# Patient Record
Sex: Male | Born: 1950 | Race: White | Hispanic: No | Marital: Married | Smoking: Never smoker
Health system: Southern US, Community
[De-identification: ages and names within clinical notes are randomized; demographics above are authoritative.]

## PROBLEM LIST (undated history)

## (undated) DIAGNOSIS — R7303 Prediabetes: Secondary | ICD-10-CM

## (undated) DIAGNOSIS — F0390 Unspecified dementia without behavioral disturbance: Secondary | ICD-10-CM

## (undated) DIAGNOSIS — M109 Gout, unspecified: Secondary | ICD-10-CM

## (undated) DIAGNOSIS — N200 Calculus of kidney: Secondary | ICD-10-CM

## (undated) DIAGNOSIS — Z87442 Personal history of urinary calculi: Secondary | ICD-10-CM

## (undated) DIAGNOSIS — N529 Male erectile dysfunction, unspecified: Secondary | ICD-10-CM

## (undated) DIAGNOSIS — I10 Essential (primary) hypertension: Secondary | ICD-10-CM

## (undated) HISTORY — DX: Gout, unspecified: M10.9

## (undated) HISTORY — DX: Calculus of kidney: N20.0

## (undated) HISTORY — DX: Prediabetes: R73.03

## (undated) HISTORY — DX: Male erectile dysfunction, unspecified: N52.9

---

## 1997-12-29 ENCOUNTER — Emergency Department (HOSPITAL_COMMUNITY): Admission: EM | Admit: 1997-12-29 | Discharge: 1997-12-29 | Payer: Self-pay | Admitting: Emergency Medicine

## 1998-06-29 ENCOUNTER — Encounter: Payer: Self-pay | Admitting: Emergency Medicine

## 1998-06-29 ENCOUNTER — Emergency Department (HOSPITAL_COMMUNITY): Admission: EM | Admit: 1998-06-29 | Discharge: 1998-06-29 | Payer: Self-pay | Admitting: Emergency Medicine

## 2005-02-13 ENCOUNTER — Ambulatory Visit: Payer: Self-pay | Admitting: Pulmonary Disease

## 2005-08-10 HISTORY — PX: COLONOSCOPY: SHX174

## 2012-07-22 ENCOUNTER — Emergency Department (HOSPITAL_COMMUNITY)
Admission: EM | Admit: 2012-07-22 | Discharge: 2012-07-22 | Disposition: A | Discharge status: Home / Self Care | Payer: BC Managed Care – PPO | Attending: Emergency Medicine | Admitting: Emergency Medicine

## 2012-07-22 ENCOUNTER — Emergency Department (HOSPITAL_COMMUNITY): Payer: BC Managed Care – PPO

## 2012-07-22 ENCOUNTER — Encounter (HOSPITAL_COMMUNITY): Payer: Self-pay | Admitting: *Deleted

## 2012-07-22 DIAGNOSIS — Z79899 Other long term (current) drug therapy: Secondary | ICD-10-CM | POA: Insufficient documentation

## 2012-07-22 DIAGNOSIS — N132 Hydronephrosis with renal and ureteral calculous obstruction: Secondary | ICD-10-CM

## 2012-07-22 DIAGNOSIS — I10 Essential (primary) hypertension: Secondary | ICD-10-CM | POA: Insufficient documentation

## 2012-07-22 DIAGNOSIS — N201 Calculus of ureter: Secondary | ICD-10-CM | POA: Insufficient documentation

## 2012-07-22 DIAGNOSIS — N23 Unspecified renal colic: Secondary | ICD-10-CM

## 2012-07-22 DIAGNOSIS — N1330 Unspecified hydronephrosis: Secondary | ICD-10-CM | POA: Insufficient documentation

## 2012-07-22 DIAGNOSIS — Z87442 Personal history of urinary calculi: Secondary | ICD-10-CM | POA: Insufficient documentation

## 2012-07-22 HISTORY — DX: Essential (primary) hypertension: I10

## 2012-07-22 LAB — COMPREHENSIVE METABOLIC PANEL
AST: 27 U/L (ref 0–37)
CO2: 25 mEq/L (ref 19–32)
Chloride: 102 mEq/L (ref 96–112)
Creatinine, Ser: 1.11 mg/dL (ref 0.50–1.35)
GFR calc non Af Amer: 70 mL/min — ABNORMAL LOW (ref >90)
Glucose, Bld: 138 mg/dL — ABNORMAL HIGH (ref 70–99)
Total Bilirubin: 0.6 mg/dL (ref 0.3–1.2)

## 2012-07-22 LAB — CBC WITH DIFFERENTIAL/PLATELET
Basophils Absolute: 0 10*3/uL (ref 0.0–0.1)
Basophils Relative: 0 % (ref 0–1)
Eosinophils Absolute: 0 10*3/uL (ref 0.0–0.7)
Eosinophils Relative: 0 % (ref 0–5)
HCT: 44.2 % (ref 39.0–52.0)
Hemoglobin: 15.7 g/dL (ref 13.0–17.0)
Lymphocytes Relative: 6 % — ABNORMAL LOW (ref 12–46)
Lymphs Abs: 0.9 10*3/uL (ref 0.7–4.0)
MCH: 31.2 pg (ref 26.0–34.0)
MCHC: 35.5 g/dL (ref 30.0–36.0)
MCV: 87.7 fL (ref 78.0–100.0)
Monocytes Absolute: 0.8 10*3/uL (ref 0.1–1.0)
Monocytes Relative: 5 % (ref 3–12)
Neutro Abs: 13.1 10*3/uL — ABNORMAL HIGH (ref 1.7–7.7)
Neutrophils Relative %: 89 % — ABNORMAL HIGH (ref 43–77)
Platelets: 216 10*3/uL (ref 150–400)
RBC: 5.04 MIL/uL (ref 4.22–5.81)
RDW: 13.4 % (ref 11.5–15.5)
WBC: 14.8 10*3/uL — ABNORMAL HIGH (ref 4.0–10.5)

## 2012-07-22 LAB — URINALYSIS, MICROSCOPIC ONLY
Glucose, UA: NEGATIVE mg/dL
Hgb urine dipstick: NEGATIVE
Ketones, ur: 40 mg/dL — AB
Leukocytes, UA: NEGATIVE
Protein, ur: NEGATIVE mg/dL

## 2012-07-22 MED ORDER — TAMSULOSIN HCL 0.4 MG PO CAPS: ORAL_CAPSULE | ORAL | Status: DC

## 2012-07-22 MED ORDER — OXYCODONE-ACETAMINOPHEN 5-325 MG PO TABS: 1.0000 | ORAL_TABLET | ORAL | Status: DC | PRN

## 2012-07-22 NOTE — ED Provider Notes (Signed)
History     CSN: [id number]  Arrival date & time 07/22/12  1406   First MD Initiated Contact with Patient 07/22/12 1612      Chief Complaint  Patient presents with  . Abdominal Pain    (Consider location/radiation/quality/duration/timing/severity/associated sxs/prior treatment) HPI Comments: Andrew Lester is a 62 y.o. male who complains of right flank pain, waxing and waning, cramp-like, since 2 AM today. He has had similar pain in the past when he had a kidney stone. He denies anorexia, but has not eaten today. There's been no fever. There's been no urinary frequency, dysuria, or hematuria. No nausea, vomiting, or diarrhea. His last bowel movement was this morning. He occasionally has constipation. He did not take any medications for the problem. There are no known modifying factors.  Patient is a 62 y.o. male presenting with abdominal pain. The history is provided by the patient.  Abdominal Pain   Past Medical History  Diagnosis Date  . Hypertension     History reviewed. No pertinent past surgical history.  No family history on file.  History  Substance Use Topics  . Smoking status: Never Smoker   . Smokeless tobacco: Not on file  . Alcohol Use: Yes     Comment: occassionally      Review of Systems  Gastrointestinal: Positive for abdominal pain.  All other systems reviewed and are negative.    Allergies  Amoxicillin and Erythromycin  Home Medications   Current Outpatient Rx  Name  Route  Sig  Dispense  Refill  . hydrochlorothiazide (HYDRODIURIL) 25 MG tablet   Oral   Take 25 mg by mouth every evening.         Marland Kitchen oxyCODONE-acetaminophen (PERCOCET/ROXICET) 5-325 MG per tablet   Oral   Take 1 tablet by mouth every 4 (four) hours as needed for pain.   20 tablet   0   . tamsulosin (FLOMAX) 0.4 MG CAPS      1 q HS to aid stone passage   7 capsule   0     BP 128/75  Pulse 86  Temp(Src) 99.4 F (37.4 C) (Oral)  Resp 16  SpO2 99%  Physical Exam   Nursing note and vitals reviewed. Constitutional: He is oriented to person, place, and time. He appears well-developed and well-nourished.  HENT:  Head: Normocephalic and atraumatic.  Right Ear: External ear normal.  Left Ear: External ear normal.  Eyes: Conjunctivae and EOM are normal. Pupils are equal, round, and reactive to light.  Neck: Normal range of motion and phonation normal. Neck supple.  Cardiovascular: Normal rate, regular rhythm, normal heart sounds and intact distal pulses.   Pulmonary/Chest: Effort normal and breath sounds normal. He exhibits no bony tenderness.  Abdominal: Soft. Normal appearance. There is no tenderness.  Genitourinary:  Mild right costovertebral angle tenderness with percussion  Musculoskeletal: Normal range of motion.  Neurological: He is alert and oriented to person, place, and time. He has normal strength. No cranial nerve deficit or sensory deficit. He exhibits normal muscle tone. Coordination normal.  Skin: Skin is warm, dry and intact.  Psychiatric: He has a normal mood and affect. His behavior is normal. Judgment and thought content normal.    ED Course  Procedures (including critical care time)  Labs Reviewed  CBC WITH DIFFERENTIAL - Abnormal; Notable for the following:    WBC 14.8 (*)    Neutrophils Relative 89 (*)    Neutro Abs 13.1 (*)    Lymphocytes Relative 6 (*)  All other components within normal limits  COMPREHENSIVE METABOLIC PANEL - Abnormal; Notable for the following:    Glucose, Bld 138 (*)    GFR calc non Af Amer 70 (*)    GFR calc Af Amer 81 (*)    All other components within normal limits  URINALYSIS, MICROSCOPIC ONLY - Abnormal; Notable for the following:    Ketones, ur 40 (*)    All other components within normal limits  LIPASE, BLOOD   Ct Abdomen Pelvis Wo Contrast  07/22/2012  *RADIOLOGY REPORT*  Clinical Data: Right flank and right lower quadrant pain.  CT ABDOMEN AND PELVIS WITHOUT CONTRAST  Technique:   Multidetector CT imaging of the abdomen and pelvis was performed following the standard protocol without intravenous contrast.  Comparison: None.  Findings: Dependent atelectatic change is seen in the lung bases. There is no pleural or pericardial effusion.  The patient has marked right hydronephrosis with stranding about the right kidney and ureter due to a 0.4 cm stone at the right UVJ. No other urinary tract stones are identified.  The kidneys are otherwise unremarkable.  A few small stones identified in the gallbladder but there is no CT evidence of cholecystitis.  The liver is diffusely low attenuating consistent with fatty infiltration.  No focal liver lesion is seen. The spleen, adrenal glands and pancreas appear normal.  Urinary bladder, seminal vesicles and prostate gland are unremarkable.  The patient has fairly extensive sigmoid diverticular disease with scattered diverticula seen throughout the remainder the colon.  No evidence of diverticulitis is identified.  The colon is otherwise unremarkable.  The stomach, small bowel and appendix appear normal. There is no lymphadenopathy or fluid.  No focal bony abnormality is identified.  IMPRESSION:  1.  Marked right hydronephrosis due to a 0.4 cm right UVJ stone. No other urinary tract stones are identified. 2.  Small gallstones without evidence of cholecystitis. 3.  Diffuse fatty infiltration of the liver. 4.  Diverticulosis without diverticulitis.   Original Report Authenticated By: Holley Dexter, M.D.      1. Ureteral colic   2. Ureteral stone with hydronephrosis       MDM  Small distal right ureteral stone, with high likelihood of passage. Doubt UTI. No renal failure. Doubt metabolic instability, serious bacterial infection or impending vascular collPercocet,apse; the patient is stable for discharge.     Plan: Home Medications- Percocet, Flomax; Home Treatments- strain urine; Recommended follow up- urology. One week     Flint Melter, MD 07/22/12 2311

## 2012-07-22 NOTE — ED Notes (Signed)
Pt escorted to discharge window. Pt verbalized understanding discharge instructions. In no acute distress.  

## 2012-07-22 NOTE — ED Notes (Signed)
Pt reports RLQ and flank pain since 0200 this am. Denies n/v. Appendicitis 38yrs ago, appendix not removed at that time. Hx kidney stones. Denies hematuria, sts pain feels different than previous stones.

## 2012-07-22 NOTE — ED Notes (Signed)
Pt alert and oriented x4. Respirations even and unlabored, bilateral symmetrical rise and fall of chest. Skin warm and dry. In no acute distress. Denies needs.   

## 2014-02-17 IMAGING — CT CT ABD-PELV W/O CM
1 series · 14 of 22 positions shown, 19 images · non-contrast
Comparison: None.

CLINICAL DATA: Right flank and right lower quadrant pain.

CT ABDOMEN AND PELVIS WITHOUT CONTRAST
TECHNIQUE: Multidetector CT imaging of the abdomen and pelvis was
performed following the standard protocol without intravenous
contrast.

[Series 4: lung · axial · 0.87mm/px · z∈[+1416,+1511]mm · 14 of 22 slices shown, 19 images]
[im 2/22  soft-tissue]
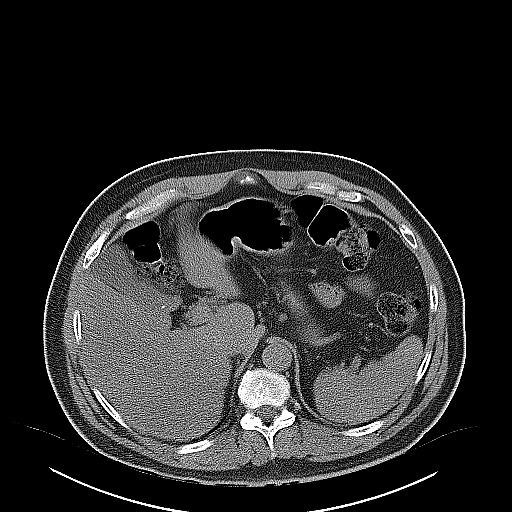
[im 2/22  bone]
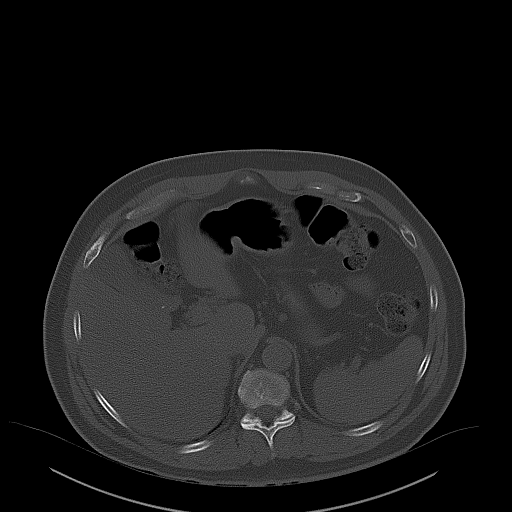
[im 4/22  soft-tissue]
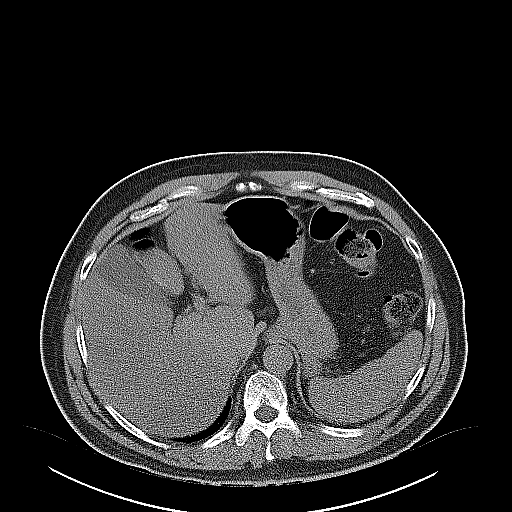
[im 5/22  soft-tissue]
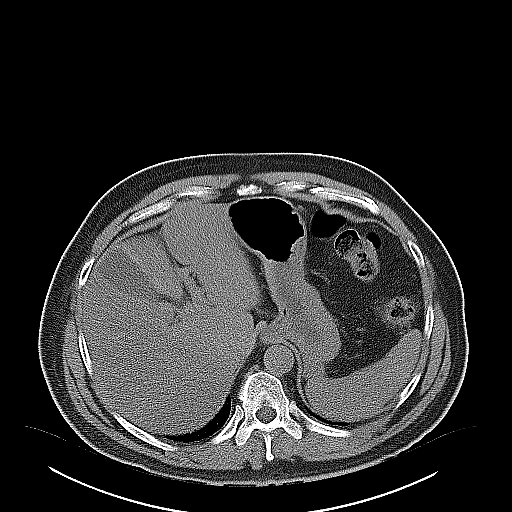
[im 7/22  soft-tissue]
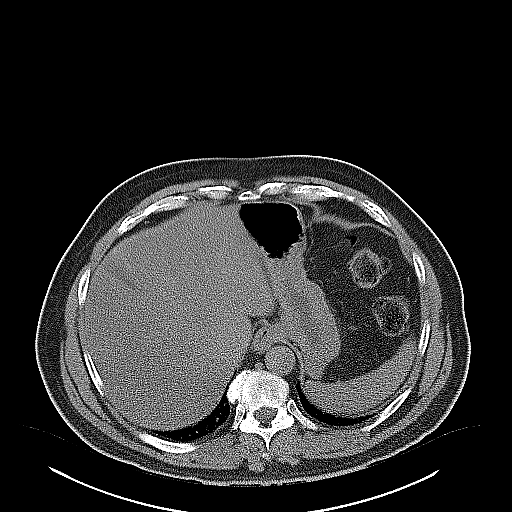
[im 8/22  soft-tissue]
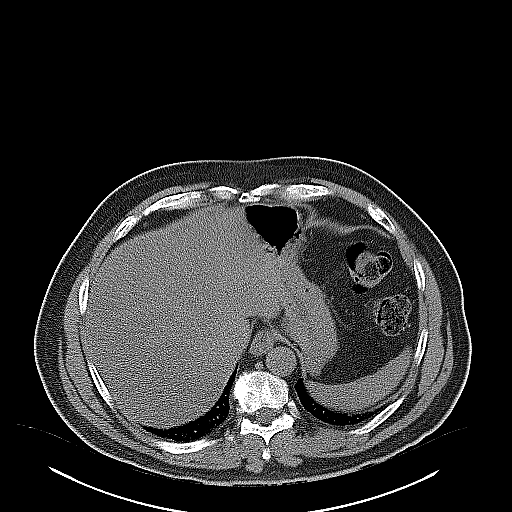
[im 10/22  soft-tissue]
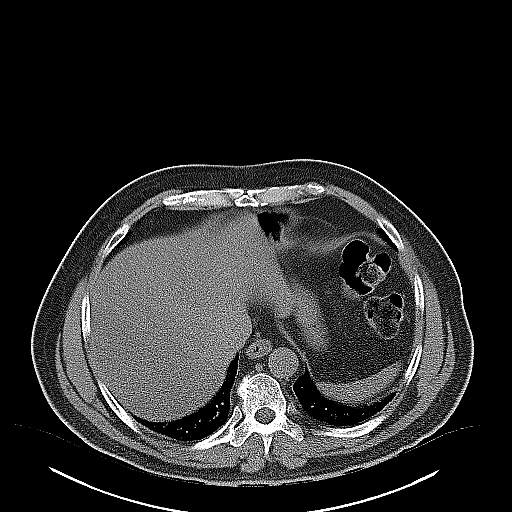
[im 12/22  soft-tissue]
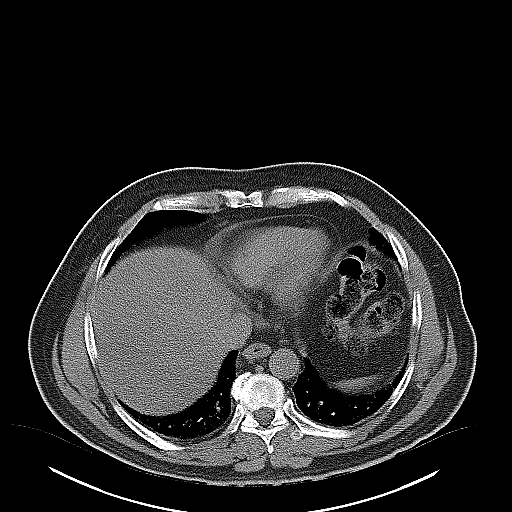
[im 13/22  soft-tissue]
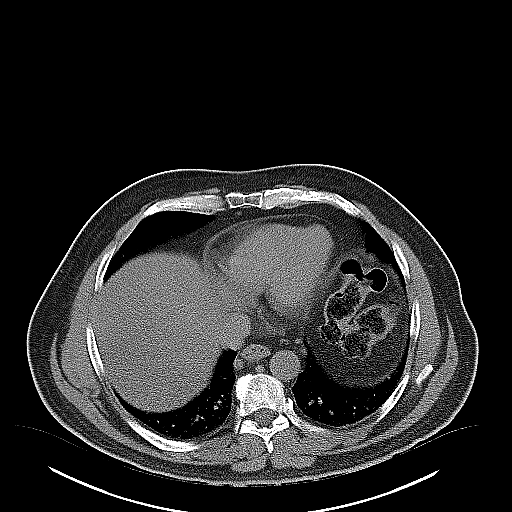
[im 15/22  soft-tissue]
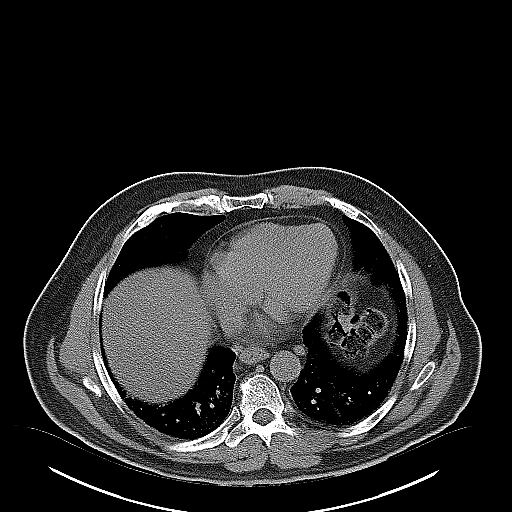
[im 15/22  bone]
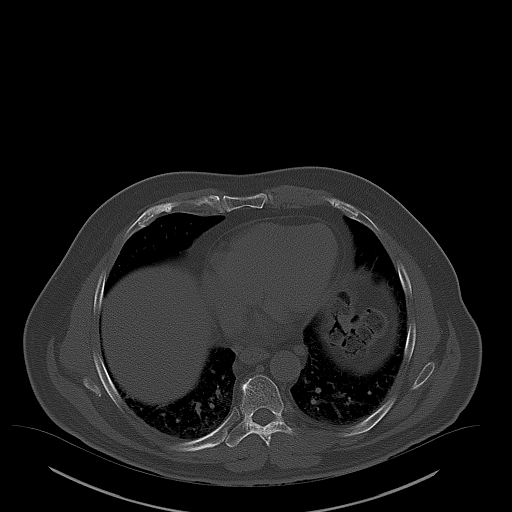
[im 16/22  soft-tissue]
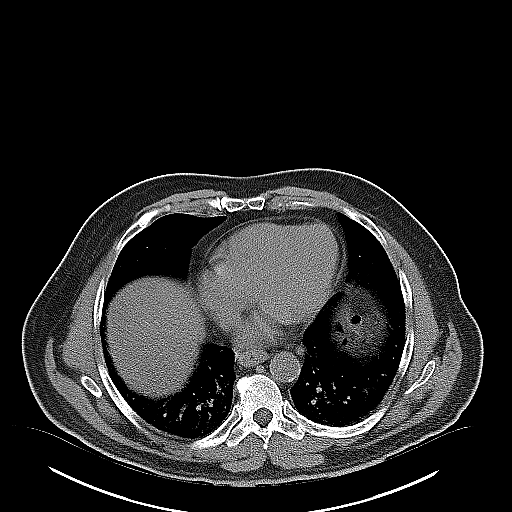
[im 18/22  soft-tissue]
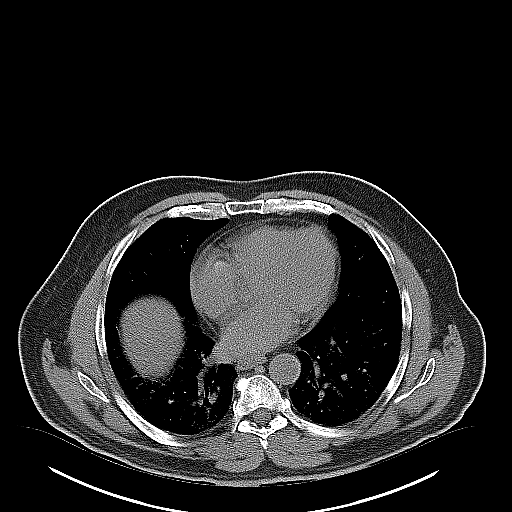
[im 18/22  lung]
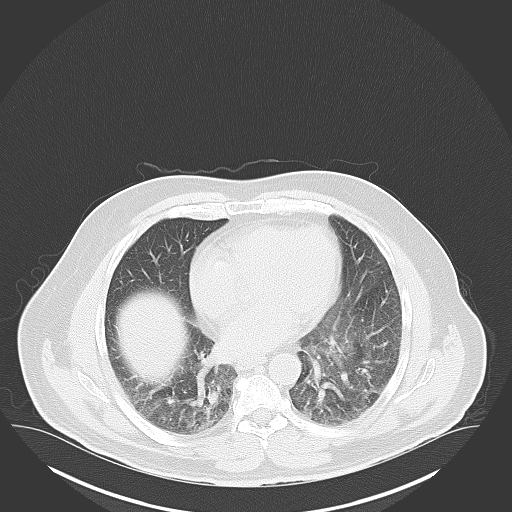
[im 19/22  soft-tissue]
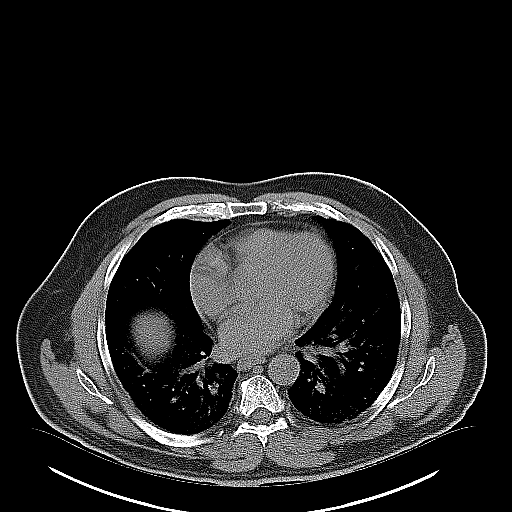
[im 19/22  lung]
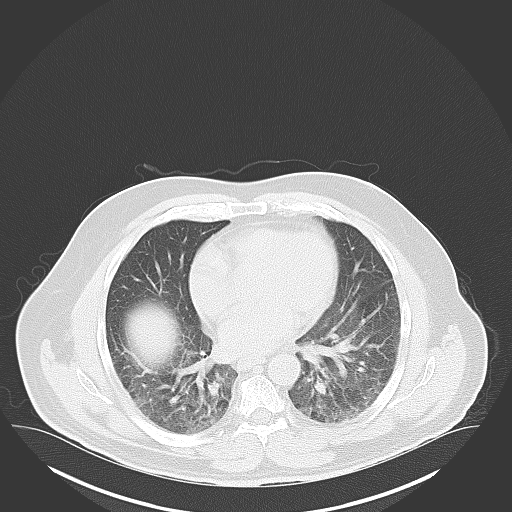
[im 20/22  lung]
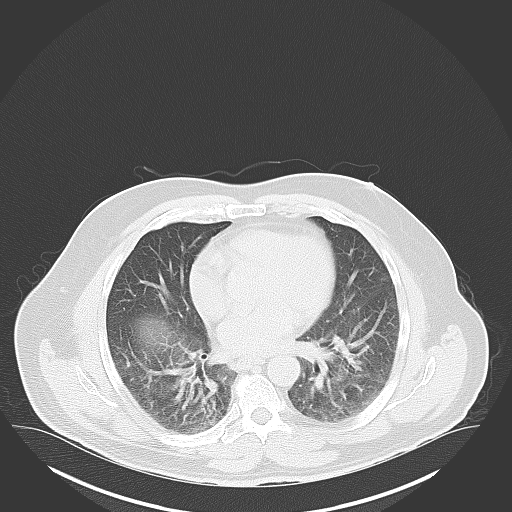
[im 21/22  soft-tissue]
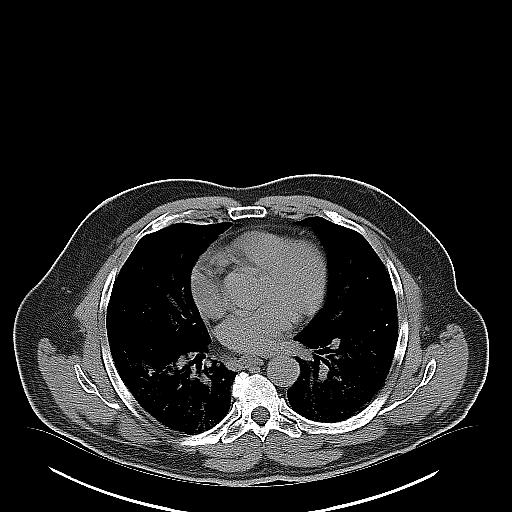
[im 21/22  lung]
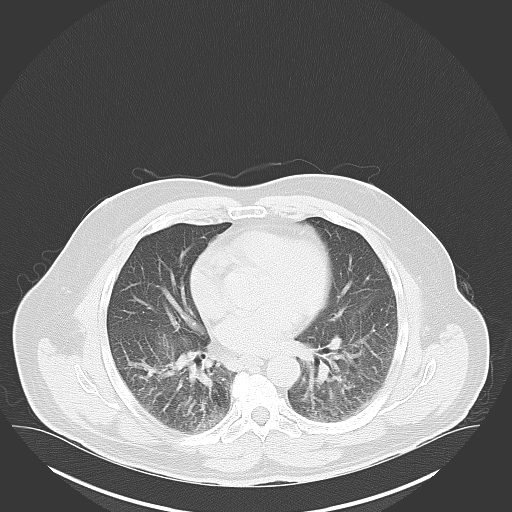

[14 of 22 positions shown; findings below may reference images not displayed]

FINDINGS: Dependent atelectatic change is seen in the lung bases.
There is no pleural or pericardial effusion.

The patient has marked right hydronephrosis with stranding about
the right kidney and ureter due to a 0.4 cm stone at the right UVJ.
No other urinary tract stones are identified.  The kidneys are
otherwise unremarkable.

A few small stones identified in the gallbladder but there is no CT
evidence of cholecystitis.  The liver is diffusely low attenuating
consistent with fatty infiltration.  No focal liver lesion is seen.
The spleen, adrenal glands and pancreas appear normal.  Urinary
bladder, seminal vesicles and prostate gland are unremarkable.

The patient has fairly extensive sigmoid diverticular disease with
scattered diverticula seen throughout the remainder the colon.  No
evidence of diverticulitis is identified.  The colon is otherwise
unremarkable.  The stomach, small bowel and appendix appear normal.
There is no lymphadenopathy or fluid.  No focal bony abnormality is
identified.
IMPRESSION: 1.  Marked right hydronephrosis due to a 0.4 cm right UVJ stone.
No other urinary tract stones are identified.
2.  Small gallstones without evidence of cholecystitis.
3.  Diffuse fatty infiltration of the liver.
4.  Diverticulosis without diverticulitis.

## 2015-07-30 ENCOUNTER — Encounter (HOSPITAL_COMMUNITY): Payer: Self-pay | Admitting: *Deleted

## 2015-07-30 ENCOUNTER — Emergency Department (HOSPITAL_COMMUNITY): Payer: BLUE CROSS/BLUE SHIELD

## 2015-07-30 ENCOUNTER — Emergency Department (HOSPITAL_COMMUNITY)
Admission: EM | Admit: 2015-07-30 | Discharge: 2015-07-30 | Disposition: A | Discharge status: Home / Self Care | Payer: BLUE CROSS/BLUE SHIELD | Attending: Emergency Medicine | Admitting: Emergency Medicine

## 2015-07-30 DIAGNOSIS — I10 Essential (primary) hypertension: Secondary | ICD-10-CM | POA: Diagnosis not present

## 2015-07-30 DIAGNOSIS — Y9289 Other specified places as the place of occurrence of the external cause: Secondary | ICD-10-CM | POA: Insufficient documentation

## 2015-07-30 DIAGNOSIS — Z880 Allergy status to penicillin: Secondary | ICD-10-CM | POA: Diagnosis not present

## 2015-07-30 DIAGNOSIS — X58XXXA Exposure to other specified factors, initial encounter: Secondary | ICD-10-CM | POA: Diagnosis not present

## 2015-07-30 DIAGNOSIS — D1779 Benign lipomatous neoplasm of other sites: Secondary | ICD-10-CM | POA: Diagnosis not present

## 2015-07-30 DIAGNOSIS — D171 Benign lipomatous neoplasm of skin and subcutaneous tissue of trunk: Secondary | ICD-10-CM

## 2015-07-30 DIAGNOSIS — Y9389 Activity, other specified: Secondary | ICD-10-CM | POA: Insufficient documentation

## 2015-07-30 DIAGNOSIS — Y990 Civilian activity done for income or pay: Secondary | ICD-10-CM | POA: Diagnosis not present

## 2015-07-30 DIAGNOSIS — S46811A Strain of other muscles, fascia and tendons at shoulder and upper arm level, right arm, initial encounter: Secondary | ICD-10-CM | POA: Diagnosis not present

## 2015-07-30 DIAGNOSIS — M791 Myalgia, unspecified site: Secondary | ICD-10-CM

## 2015-07-30 DIAGNOSIS — S4991XA Unspecified injury of right shoulder and upper arm, initial encounter: Secondary | ICD-10-CM | POA: Diagnosis present

## 2015-07-30 DIAGNOSIS — S29012A Strain of muscle and tendon of back wall of thorax, initial encounter: Secondary | ICD-10-CM

## 2015-07-30 LAB — CBC
HCT: 45.6 % (ref 39.0–52.0)
HEMOGLOBIN: 16.1 g/dL (ref 13.0–17.0)
MCH: 30.7 pg (ref 26.0–34.0)
MCHC: 35.3 g/dL (ref 30.0–36.0)
MCV: 86.9 fL (ref 78.0–100.0)
PLATELETS: 202 10*3/uL (ref 150–400)
RBC: 5.25 MIL/uL (ref 4.22–5.81)
RDW: 12.9 % (ref 11.5–15.5)
WBC: 8.7 10*3/uL (ref 4.0–10.5)

## 2015-07-30 LAB — BASIC METABOLIC PANEL
Anion gap: 10 (ref 5–15)
BUN: 14 mg/dL (ref 6–20)
CHLORIDE: 105 mmol/L (ref 101–111)
CO2: 27 mmol/L (ref 22–32)
CREATININE: 0.87 mg/dL (ref 0.61–1.24)
Calcium: 9.5 mg/dL (ref 8.9–10.3)
Glucose, Bld: 128 mg/dL — ABNORMAL HIGH (ref 65–99)
POTASSIUM: 4.4 mmol/L (ref 3.5–5.1)
SODIUM: 142 mmol/L (ref 135–145)

## 2015-07-30 LAB — I-STAT TROPONIN, ED: TROPONIN I, POC: 0.01 ng/mL (ref 0.00–0.08)

## 2015-07-30 MED ORDER — HYDROCODONE-ACETAMINOPHEN 5-325 MG PO TABS: 2.0000 | ORAL_TABLET | ORAL | Status: DC | PRN

## 2015-07-30 MED ORDER — NAPROXEN 500 MG PO TABS: 500.0000 mg | ORAL_TABLET | Freq: Two times a day (BID) | ORAL | Status: DC

## 2015-07-30 MED ORDER — METHOCARBAMOL 500 MG PO TABS: 500.0000 mg | ORAL_TABLET | Freq: Three times a day (TID) | ORAL | Status: DC | PRN

## 2015-07-30 MED ORDER — HYDROCODONE-ACETAMINOPHEN 5-325 MG PO TABS
1.0000 | ORAL_TABLET | Freq: Once | ORAL | Status: AC
Start: 2015-07-30 — End: 2015-07-30
  Administered 2015-07-30: 1 via ORAL
  Filled 2015-07-30: qty 1

## 2015-07-30 MED ORDER — IBUPROFEN 800 MG PO TABS
800.0000 mg | ORAL_TABLET | Freq: Once | ORAL | Status: AC
  Administered 2015-07-30: 800 mg via ORAL
  Filled 2015-07-30: qty 1

## 2015-07-30 MED ORDER — CYCLOBENZAPRINE HCL 10 MG PO TABS
10.0000 mg | ORAL_TABLET | Freq: Once | ORAL | Status: AC
  Administered 2015-07-30: 10 mg via ORAL
  Filled 2015-07-30: qty 1

## 2015-07-30 NOTE — Discharge Instructions (Signed)
Lipoma A lipoma is a noncancerous (benign) tumor that is made up of fat cells. This is a very common type of soft-tissue growth. Lipomas are usually found under the skin (subcutaneous). They may occur in any tissue of the body that contains fat. Common areas for lipomas to appear include the back, shoulders, buttocks, and thighs. Lipomas grow slowly, and they are usually painless. Most lipomas do not cause problems and do not require treatment. CAUSES The cause of this condition is not known. RISK FACTORS This condition is more likely to develop in:  People who are 53-6 years old.  People who have a family history of lipomas. SYMPTOMS A lipoma usually appears as a small, round bump under the skin. It may feel soft or rubbery, but the firmness can vary. Most lipomas are not painful. However, a lipoma may become painful if it is located in an area where it pushes on nerves. DIAGNOSIS A lipoma can usually be diagnosed with a physical exam. You may also have tests to confirm the diagnosis and to rule out other conditions. Tests may include:  Imaging tests, such as a CT scan or MRI.  Removal of a tissue sample to be looked at under a microscope (biopsy). TREATMENT Treatment is not needed for small lipomas that are not causing problems. If a lipoma continues to get bigger or it causes problems, removal is often the best option. Lipomas can also be removed to improve appearance. Removal of a lipoma is usually done with a surgery in which the fatty cells and the surrounding capsule are removed. Most often, a medicine that numbs the area (local anesthetic) is used for this procedure. HOME CARE INSTRUCTIONS  Keep all follow-up visits as directed by your health care provider. This is important. SEEK MEDICAL CARE IF:  Your lipoma becomes larger or hard.  Your lipoma becomes painful, red, or increasingly swollen. These could be signs of infection or a more serious condition.   This information is  not intended to replace advice given to you by your health care provider. Make sure you discuss any questions you have with your health care provider.   Document Released: 04/27/2002 Document Revised: 09/21/2014 Document Reviewed: 05/03/2014 Elsevier Interactive Patient Education 2016 [place].  Muscle Strain A muscle strain is an injury that occurs when a muscle is stretched beyond its normal length. Usually a small number of muscle fibers are torn when this happens. Muscle strain is rated in degrees. First-degree strains have the least amount of muscle fiber tearing and pain. Second-degree and third-degree strains have increasingly more tearing and pain.  Usually, recovery from muscle strain takes 1-2 weeks. Complete healing takes 5-6 weeks.  CAUSES  Muscle strain happens when a sudden, violent force placed on a muscle stretches it too far. This may occur with lifting, sports, or a fall.  RISK FACTORS Muscle strain is especially common in athletes.  SIGNS AND SYMPTOMS At the site of the muscle strain, there may be:  Pain.  Bruising.  Swelling.  Difficulty using the muscle due to pain or lack of normal function. DIAGNOSIS  Your health care provider will perform a physical exam and ask about your medical history. TREATMENT  Often, the best treatment for a muscle strain is resting, icing, and applying cold compresses to the injured area.  HOME CARE INSTRUCTIONS   Use the PRICE method of treatment to promote muscle healing during the first 2-3 days after your injury. The PRICE method involves:  Protecting the muscle from  being injured again.  Restricting your activity and resting the injured body part.  Icing your injury. To do this, put ice in a plastic bag. Place a towel between your skin and the bag. Then, apply the ice and leave it on from 15-20 minutes each hour. After the third day, switch to moist heat packs.  Apply compression to the injured area with a splint or  elastic bandage. Be careful not to wrap it too tightly. This may interfere with blood circulation or increase swelling.  Elevate the injured body part above the level of your heart as often as you can.  Only take over-the-counter or prescription medicines for pain, discomfort, or fever as directed by your health care provider.  Warming up prior to exercise helps to prevent future muscle strains. SEEK MEDICAL CARE IF:   You have increasing pain or swelling in the injured area.  You have numbness, tingling, or a significant loss of strength in the injured area. MAKE SURE YOU:   Understand these instructions.  Will watch your condition.  Will get help right away if you are not doing well or get worse.   This information is not intended to replace advice given to you by your health care provider. Make sure you discuss any questions you have with your health care provider.   Document Released: 05/07/2005 Document Revised: 02/25/2013 Document Reviewed: 12/04/2012 Elsevier Interactive Patient Education 2016 [employer]  Musculoskeletal Pain Musculoskeletal pain is muscle and boney aches and pains. These pains can occur in any part of the body. Your caregiver may treat you without knowing the cause of the pain. They may treat you if blood or urine tests, X-rays, and other tests were normal.  CAUSES There is often not a definite cause or reason for these pains. These pains may be caused by a type of germ (virus). The discomfort may also come from overuse. Overuse includes working out too hard when your body is not fit. Boney aches also come from weather changes. Bone is sensitive to atmospheric pressure changes. HOME CARE INSTRUCTIONS   Ask when your test results will be ready. Make sure you get your test results.  Only take over-the-counter or prescription medicines for pain, discomfort, or fever as directed by your caregiver. If you were given medications for your condition, do not  drive, operate machinery or power tools, or sign legal documents for 24 hours. Do not drink alcohol. Do not take sleeping pills or other medications that may interfere with treatment.  Continue all activities unless the activities cause more pain. When the pain lessens, slowly resume normal activities. Gradually increase the intensity and duration of the activities or exercise.  During periods of severe pain, bed rest may be helpful. Lay or sit in any position that is comfortable.  Putting ice on the injured area.  Put ice in a bag.  Place a towel between your skin and the bag.  Leave the ice on for 15 to 20 minutes, 3 to 4 times a day.  Follow up with your caregiver for continued problems and no reason can be found for the pain. If the pain becomes worse or does not go away, it may be necessary to repeat tests or do additional testing. Your caregiver may need to look further for a possible cause. SEEK IMMEDIATE MEDICAL CARE IF:  You have pain that is getting worse and is not relieved by medications.  You develop chest pain that is associated with shortness or breath, sweating, feeling  sick to your stomach (nauseous), or throw up (vomit).  Your pain becomes localized to the abdomen.  You develop any new symptoms that seem different or that concern you. MAKE SURE YOU:   Understand these instructions.  Will watch your condition.  Will get help right away if you are not doing well or get worse.   This information is not intended to replace advice given to you by your health care provider. Make sure you discuss any questions you have with your health care provider.   Document Released: 05/07/2005 Document Revised: 07/30/2011 Document Reviewed: 01/09/2013 Elsevier Interactive Patient Education [employer].

## 2015-07-30 NOTE — ED Provider Notes (Signed)
CSN: [id number]     Arrival date & time 07/30/15  1104 History   First MD Initiated Contact with Patient 07/30/15 1547     Chief Complaint  Patient presents with  . Back Pain  . Chest Pain      HPI  Patient presents for evaluation of back pain. He works as an Clinical biochemist. Does not recall any particular incident that may have incited his pain. However, he has had pain is right shoulder and spine last several days. Hurts to cough breathing move. Has not had a spontaneous cough. Is not short of breath. No anterior chest pain or exertional pain.  Past Medical History  Diagnosis Date  . Hypertension    History reviewed. No pertinent past surgical history. History reviewed. No pertinent family history. Social History  Substance Use Topics  . Smoking status: Never Smoker   . Smokeless tobacco: None  . Alcohol Use: No     Comment: occassionally    Review of Systems  Constitutional: Negative for fever, chills, diaphoresis, appetite change and fatigue.  HENT: Negative for mouth sores, sore throat and trouble swallowing.   Eyes: Negative for visual disturbance.  Respiratory: Negative for cough, chest tightness, shortness of breath and wheezing.   Cardiovascular: Negative for chest pain.  Gastrointestinal: Negative for nausea, vomiting, abdominal pain, diarrhea and abdominal distention.  Endocrine: Negative for polydipsia, polyphagia and polyuria.  Genitourinary: Negative for dysuria, frequency and hematuria.  Musculoskeletal: Positive for back pain. Negative for gait problem.  Skin: Negative for color change, pallor and rash.  Neurological: Negative for dizziness, syncope, light-headedness and headaches.  Hematological: Does not bruise/bleed easily.  Psychiatric/Behavioral: Negative for behavioral problems and confusion.      Allergies  Amoxicillin and Erythromycin  Home Medications   Prior to Admission medications   Medication Sig Start Date End Date Taking? Authorizing  Provider  amLODipine (NORVASC) 5 MG tablet Take 5 mg by mouth daily.   Yes Historical Provider, MD  HYDROcodone-acetaminophen (NORCO/VICODIN) 5-325 MG tablet Take 2 tablets by mouth every 4 (four) hours as needed. 07/30/15   Tanna Furry, MD  methocarbamol (ROBAXIN) 500 MG tablet Take 1 tablet (500 mg total) by mouth 3 (three) times daily between meals as needed. 07/30/15   Tanna Furry, MD  naproxen (NAPROSYN) 500 MG tablet Take 1 tablet (500 mg total) by mouth 2 (two) times daily. 07/30/15   Tanna Furry, MD   BP 173/89 mmHg  Pulse 93  Temp(Src) 98 F (36.7 C) (Oral)  Resp 18  SpO2 96% Physical Exam  Constitutional: He is oriented to person, place, and time. He appears well-developed and well-nourished. No distress.  HENT:  Head: Normocephalic.  Eyes: Conjunctivae are normal. Pupils are equal, round, and reactive to light. No scleral icterus.  Neck: Normal range of motion. Neck supple. No thyromegaly present.  Cardiovascular: Normal rate and regular rhythm.  Exam reveals no gallop and no friction rub.   No murmur heard. Pulmonary/Chest: Effort normal and breath sounds normal. No respiratory distress. He has no wheezes. He has no rales.    Abdominal: Soft. Bowel sounds are normal. He exhibits no distension. There is no tenderness. There is no rebound.  Musculoskeletal: Normal range of motion.  Neurological: He is alert and oriented to person, place, and time.  Skin: Skin is warm and dry. No rash noted.  Psychiatric: He has a normal mood and affect. His behavior is normal.    ED Course  Procedures (including critical care time) Labs Review Labs Reviewed  BASIC METABOLIC PANEL - Abnormal; Notable for the following:    Glucose, Bld 128 (*)    All other components within normal limits  CBC  I-STAT TROPOININ, ED    Imaging Review Dg Chest 2 View  07/30/2015  CLINICAL DATA:  65 year old male with a history of right-sided pain EXAM: CHEST - 2 VIEW COMPARISON:  None. FINDINGS:  Cardiomediastinal silhouette projects within normal limits in size and contour. No confluent airspace disease, pneumothorax, or pleural effusion. Nonspecific pattern of interstitial opacities, likely chronic. Low lung volumes. No displaced fracture. Unremarkable appearance of the upper abdomen. IMPRESSION: No radiographic evidence of acute cardiopulmonary disease, with background of likely chronic changes. Signed, Dulcy Fanny. Earleen Newport, DO [facility] Radiology Electronically Signed   By: Corrie Mckusick D.O.   On: 07/30/2015 12:11   I have personally reviewed and evaluated these images and lab results as part of my medical decision-making.   EKG Interpretation   Date/Time:  Saturday July 30 2015 11:43:58 EST Ventricular Rate:  82 PR Interval:  154 QRS Duration: 93 QT Interval:  367 QTC Calculation: 429 R Axis:   108 Text Interpretation:  Sinus rhythm Right ventricular hypertrophy Confirmed  by Jeneen Rinks  MD, Bryan ([id number]) on 07/30/2015 3:49:37 PM      MDM   Final diagnoses:  Muscular pain  Rhomboid muscle strain, initial encounter  Lipoma of back    Patient with a palpable lipoma in his low lumbar paraspinal muscle suture. He has some tenderness over the rhomboids and pain with activation of the rhomboids. Negative EKG for acute changes. Chest x-ray normal. Normal labs. Plan anti-inflammatories, pain medicine, muscle relaxants. Primary care follow-up. Physical therapy if not improving.    Tanna Furry, MD 07/30/15 [phone]

## 2015-07-30 NOTE — ED Notes (Signed)
Pt admits to right upper back pain that began while pt was at work, denies any known mechanism of injury - states he experiences chest pain w/ deep inspiration. Denies shortness of breath.

## 2017-12-24 DIAGNOSIS — H524 Presbyopia: Secondary | ICD-10-CM | POA: Diagnosis not present

## 2017-12-24 DIAGNOSIS — H35363 Drusen (degenerative) of macula, bilateral: Secondary | ICD-10-CM | POA: Diagnosis not present

## 2018-01-08 ENCOUNTER — Emergency Department (HOSPITAL_COMMUNITY)
Admission: EM | Admit: 2018-01-08 | Discharge: 2018-01-08 | Disposition: A | Discharge status: Home / Self Care | Payer: Medicare HMO | Attending: Emergency Medicine | Admitting: Emergency Medicine

## 2018-01-08 ENCOUNTER — Encounter (HOSPITAL_COMMUNITY): Payer: Self-pay | Admitting: Emergency Medicine

## 2018-01-08 ENCOUNTER — Other Ambulatory Visit: Payer: Self-pay

## 2018-01-08 ENCOUNTER — Emergency Department (HOSPITAL_COMMUNITY): Payer: Medicare HMO

## 2018-01-08 DIAGNOSIS — R0981 Nasal congestion: Secondary | ICD-10-CM | POA: Diagnosis not present

## 2018-01-08 DIAGNOSIS — I499 Cardiac arrhythmia, unspecified: Secondary | ICD-10-CM | POA: Insufficient documentation

## 2018-01-08 DIAGNOSIS — R002 Palpitations: Secondary | ICD-10-CM | POA: Diagnosis not present

## 2018-01-08 DIAGNOSIS — I10 Essential (primary) hypertension: Secondary | ICD-10-CM | POA: Diagnosis not present

## 2018-01-08 DIAGNOSIS — R0602 Shortness of breath: Secondary | ICD-10-CM | POA: Diagnosis not present

## 2018-01-08 DIAGNOSIS — Z79899 Other long term (current) drug therapy: Secondary | ICD-10-CM | POA: Insufficient documentation

## 2018-01-08 LAB — CBC WITH DIFFERENTIAL/PLATELET
Basophils Absolute: 0 10*3/uL (ref 0.0–0.1)
Basophils Relative: 0 %
Eosinophils Absolute: 0.2 10*3/uL (ref 0.0–0.7)
Eosinophils Relative: 2 %
HEMATOCRIT: 47.1 % (ref 39.0–52.0)
HEMOGLOBIN: 16.6 g/dL (ref 13.0–17.0)
LYMPHS ABS: 1.7 10*3/uL (ref 0.7–4.0)
LYMPHS PCT: 16 %
MCH: 31 pg (ref 26.0–34.0)
MCHC: 35.2 g/dL (ref 30.0–36.0)
MCV: 88 fL (ref 78.0–100.0)
MONO ABS: 0.7 10*3/uL (ref 0.1–1.0)
MONOS PCT: 7 %
NEUTROS ABS: 7.9 10*3/uL — AB (ref 1.7–7.7)
NEUTROS PCT: 75 %
Platelets: 206 10*3/uL (ref 150–400)
RBC: 5.35 MIL/uL (ref 4.22–5.81)
RDW: 13.6 % (ref 11.5–15.5)
WBC: 10.5 10*3/uL (ref 4.0–10.5)

## 2018-01-08 LAB — BASIC METABOLIC PANEL
Anion gap: 12 (ref 5–15)
BUN: 24 mg/dL — ABNORMAL HIGH (ref 8–23)
CALCIUM: 9.5 mg/dL (ref 8.9–10.3)
CHLORIDE: 108 mmol/L (ref 98–111)
CO2: 23 mmol/L (ref 22–32)
CREATININE: 1.33 mg/dL — AB (ref 0.61–1.24)
GFR calc Af Amer: >60 mL/min (ref >60)
GFR calc non Af Amer: 54 mL/min — ABNORMAL LOW (ref >60)
GLUCOSE: 110 mg/dL — AB (ref 70–99)
Potassium: 4 mmol/L (ref 3.5–5.1)
Sodium: 143 mmol/L (ref 135–145)

## 2018-01-08 LAB — MAGNESIUM: Magnesium: 2.3 mg/dL (ref 1.7–2.4)

## 2018-01-08 MED ORDER — FLUTICASONE PROPIONATE 50 MCG/ACT NA SUSP: 2.0000 | Freq: Every day | NASAL | 2 refills | Status: DC

## 2018-01-08 MED ORDER — SALINE SPRAY 0.65 % NA SOLN: 1.0000 | NASAL | 0 refills | Status: DC | PRN

## 2018-01-08 NOTE — Discharge Instructions (Signed)
Please follow-up with primary care doctor to recheck creatinine.  Please increase oral hydration.

## 2018-01-08 NOTE — ED Notes (Signed)
Family at bedside. 

## 2018-01-08 NOTE — ED Notes (Addendum)
Pt reports difficulty taking a breath x 2 days.  States he checked his pulse and it was irregular like "it's skipping a beat."  Denies cp, dizziness, or n/v.  Pt is A&Ox 4.  In NAD

## 2018-01-08 NOTE — ED Provider Notes (Signed)
[facility] DEPT Provider Note   CSN: [id number] Arrival date & time: 01/08/18  1440     History   Chief Complaint Chief Complaint  Patient presents with  . Shortness of Breath  . Irregular Heart Beat    HPI Andrew Lester is a 67 y.o. male.  The history is provided by the patient.  Palpitations   This is a recurrent problem. The current episode started 3 to 5 hours ago. The problem occurs every several days. The problem has been gradually improving. The problem is associated with an unknown factor. Associated symptoms include shortness of breath (stuffy nose). Pertinent negatives include no diaphoresis, no fever, no chest pain, no chest pressure, no claudication, no exertional chest pressure, no near-syncope, no orthopnea, no syncope, no abdominal pain, no vomiting, no headaches, no back pain, no leg pain, no lower extremity edema, no cough and no hemoptysis. He has tried nothing for the symptoms. The treatment provided no relief. There are no known risk factors.    Past Medical History:  Diagnosis Date  . Hypertension     There are no active problems to display for this patient.   History reviewed. No pertinent surgical history.      Home Medications    Prior to Admission medications   Medication Sig Start Date End Date Taking? Authorizing Provider  OVER THE COUNTER MEDICATION Take 1 lozenge by mouth daily as needed (nasal congestion). Eucalyptus lozenge   Yes [provider]  fluticasone (FLONASE) 50 MCG/ACT nasal spray Place 2 sprays into both nostrils daily. 01/08/18   Stormey Wilborn, DO  HYDROcodone-acetaminophen (NORCO/VICODIN) 5-325 MG tablet Take 2 tablets by mouth every 4 (four) hours as needed. Patient not taking: Reported on 01/08/2018 07/30/15   Tanna Furry, MD  methocarbamol (ROBAXIN) 500 MG tablet Take 1 tablet (500 mg total) by mouth 3 (three) times daily between meals as needed. Patient not taking: Reported on 01/08/2018  07/30/15   Tanna Furry, MD  naproxen (NAPROSYN) 500 MG tablet Take 1 tablet (500 mg total) by mouth 2 (two) times daily. Patient not taking: Reported on 01/08/2018 07/30/15   Tanna Furry, MD  sodium chloride (OCEAN) 0.65 % SOLN nasal spray Place 1 spray into both nostrils as needed for congestion. 01/08/18   Lennice Sites, DO    Family History No family history on file.  Social History Social History   Tobacco Use  . Smoking status: Never Smoker  Substance Use Topics  . Alcohol use: No    Comment: occassionally  . Drug use: No     Allergies   Amoxicillin and Erythromycin   Review of Systems Review of Systems  Constitutional: Negative for chills, diaphoresis and fever.  HENT: Negative for ear pain and sore throat.   Eyes: Negative for pain and visual disturbance.  Respiratory: Positive for shortness of breath (stuffy nose). Negative for cough and hemoptysis.   Cardiovascular: Positive for palpitations. Negative for chest pain, orthopnea, claudication, syncope and near-syncope.  Gastrointestinal: Negative for abdominal pain and vomiting.  Genitourinary: Negative for dysuria and hematuria.  Musculoskeletal: Negative for arthralgias and back pain.  Skin: Negative for color change and rash.  Neurological: Negative for seizures, syncope and headaches.  All other systems reviewed and are negative.    Physical Exam Updated Vital Signs  ED Triage Vitals [01/08/18 1447]  Enc Vitals Group     BP (!) 161/71     Pulse Rate 84     Resp 18  Temp 98.3 F (36.8 C)     Temp Source Oral     SpO2 100 %     Weight      Height      Head Circumference      Peak Flow      Pain Score 0     Pain Loc      Pain Edu?      Excl. in [place]?     Physical Exam  Constitutional: He appears well-developed and well-nourished.  HENT:  Head: Normocephalic and atraumatic.  Mouth/Throat: Oropharynx is clear and moist.  Eyes: Pupils are equal, round, and reactive to light. Conjunctivae and EOM  are normal.  Neck: Normal range of motion. Neck supple.  Cardiovascular: Normal rate, regular rhythm, normal heart sounds and intact distal pulses.  No murmur heard. Pulmonary/Chest: Effort normal and breath sounds normal. No respiratory distress. He has no decreased breath sounds. He has no wheezes. He has no rhonchi. He has no rales.  Abdominal: Soft. There is no tenderness.  Musculoskeletal: Normal range of motion. He exhibits no edema.       Right lower leg: He exhibits no edema.       Left lower leg: He exhibits no edema.  Neurological: He is alert.  Skin: Skin is warm and dry. Capillary refill takes less than 2 seconds.  Psychiatric: He has a normal mood and affect.  Nursing note and vitals reviewed.    ED Treatments / Results  Labs (all labs ordered are listed, but only abnormal results are displayed) Labs Reviewed  CBC WITH DIFFERENTIAL/PLATELET - Abnormal; Notable for the following components:      Result Value   Neutro Abs 7.9 (*)    All other components within normal limits  BASIC METABOLIC PANEL - Abnormal; Notable for the following components:   Glucose, Bld 110 (*)    BUN 24 (*)    Creatinine, Ser 1.33 (*)    GFR calc non Af Amer 54 (*)    All other components within normal limits  MAGNESIUM    EKG EKG Interpretation  Date/Time:  Wednesday January 08 2018 14:46:56 EDT Ventricular Rate:  87 PR Interval:    QRS Duration: 89 QT Interval:  384 QTC Calculation: 462 R Axis:   30 Text Interpretation:  Sinus rhythm Ventricular trigeminy Consider left atrial enlargement PACs Reconfirmed by Lennice Sites [phone]) on 01/08/2018 4:03:02 PM   Radiology Dg Chest 2 View  Result Date: 01/08/2018 CLINICAL DATA:  Shortness of breath for 3 days. EXAM: CHEST - 2 VIEW COMPARISON:  PA and lateral chest 07/30/2015. FINDINGS: Lungs are clear. Heart size is normal. Aortic atherosclerosis is noted. No pneumothorax or pleural effusion. Thoracic spondylosis is seen. No acute bony  abnormality. IMPRESSION: No acute disease. Atherosclerosis. Electronically Signed   By: Inge Rise M.D.   On: 01/08/2018 15:09    Procedures Procedures (including critical care time)  Medications Ordered in ED Medications - No data to display   Initial Impression / Assessment and Plan / ED Course  I have reviewed the triage vital signs and the nursing notes.  Pertinent labs & imaging results that were available during my care of the patient were reviewed by me and considered in my medical decision making (see chart for details).     Andrew Lester is a 67 year old male with history of hypertension who presents to the ED with stuffy nose and palpitations.  Patient with unremarkable vitals.  No fever.  Patient states intermittent palpitations over  the last several days to weeks.  EKG shows PACs but otherwise no ischemic changes.  While on the monitor in the room patient has PAC every 10 beats or so.  Denies any chest pain.  Denies any shortness of breath.  Patient mostly with allergy type symptoms with stuffy nose.  Patient denies any alcohol use but does admit to some excessive caffeine use.  Exam is overall unremarkable.  Patient had mild AKI with a creatinine of 1.3 and states that he has not had much water today.  States that he was working outside in the heat today.  Otherwise electrolytes unremarkable.  Normal potassium and magnesium.  No significant leukocytosis or anemia.  Patient given prescription for Flonase and nasal saline for stuffy nose.  Given information to follow-up with cardiology about palpitations.  Would likely benefit from heart monitor.  No concerning findings on EKGs at this time.  Told to refrain from caffeine if possible.  Offered patient IV hydration given AKI but he rather go home and increase oral fluids and given information to follow-up with primary care doctor to recheck kidney function.  Discharged from ED in good condition and told to return to the ED if symptoms  worsen.  Final Clinical Impressions(s) / ED Diagnoses   Final diagnoses:  Palpitations  Stuffy nose    ED Discharge Orders         Ordered    fluticasone (FLONASE) 50 MCG/ACT nasal spray  Daily     01/08/18 1707    sodium chloride (OCEAN) 0.65 % SOLN nasal spray  As needed     01/08/18 [place], Krayton Wortley, DO 01/08/18 1848

## 2018-01-08 NOTE — ED Triage Notes (Signed)
Patient c/o SOB x3 days. Denies chest pain and cough. Irregular heart rate in triage.

## 2018-01-08 NOTE — ED Notes (Signed)
Pt declined wheelchair.

## 2018-01-28 ENCOUNTER — Ambulatory Visit (INDEPENDENT_AMBULATORY_CARE_PROVIDER_SITE_OTHER): Payer: Medicare HMO | Admitting: Cardiovascular Disease

## 2018-01-28 ENCOUNTER — Encounter: Payer: Self-pay | Admitting: Cardiovascular Disease

## 2018-01-28 VITALS — BP 116/68 | HR 93 | Ht 68.0 in | Wt 169.0 lb

## 2018-01-28 DIAGNOSIS — R0602 Shortness of breath: Secondary | ICD-10-CM | POA: Diagnosis not present

## 2018-01-28 DIAGNOSIS — I493 Ventricular premature depolarization: Secondary | ICD-10-CM | POA: Diagnosis not present

## 2018-01-28 DIAGNOSIS — R634 Abnormal weight loss: Secondary | ICD-10-CM

## 2018-01-28 MED ORDER — METOPROLOL SUCCINATE ER 25 MG PO TB24: 25.0000 mg | ORAL_TABLET | Freq: Every day | ORAL | 3 refills | Status: DC

## 2018-01-28 NOTE — Patient Instructions (Signed)
Medication Instructions:  START Toprol XL (Metoprolol) 25 mg once daily   Labwork: TODAY - PSA, TSH   Testing/Procedures: Your physician has requested that you have an echocardiogram. Echocardiography is a painless test that uses sound waves to create images of your heart. It provides your doctor with information about the size and shape of your heart and how well your heart's chambers and valves are working. This procedure takes approximately one hour. There are no restrictions for this procedure.   Follow-Up: Your physician recommends that you schedule a follow-up appointment in: 3 months with Dr. Acie Fredrickson   If you need a refill on your cardiac medications before your next appointment, please call your pharmacy.   Thank you for choosing [facility]! Christen Bame, RN [phone]

## 2018-01-28 NOTE — Progress Notes (Signed)
Cardiology Office Note:    Date:  01/28/2018   ID:  Andrew Lester, DOB [birth year], MRN [id number]  PCP:  Patient, No Pcp Per  Cardiologist:  No primary care provider on file.   Referring MD: No ref. provider found   Problem list 1.  Premature ventricular contractions 2.  Weight loss   Chief Complaint  Patient presents with  . Palpitations     History of Present Illness:    Andrew Lester is a 67 y.o. male with a hx of  Palpitations  He recently had some sinus congestion.   Was having some shortness of breath  Went to the ER .    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 Has some anxiety that causes him to feel shortness of breath .  Has lost lots of weight Has lost from 238 to 169 in 6 months .    Has not seen his primary MD for this weight loss .  Feels hotter than normal ,   Anxiety is worse recently .  No diarrhea  No blood in stool or tarry stool     Past Medical History:  Diagnosis Date  . Hypertension     History reviewed. No pertinent surgical history.  Current Medications: Current Meds  Medication Sig  . fluticasone (FLONASE) 50 MCG/ACT nasal spray Place 2 sprays into both nostrils daily.  Marland Kitchen OVER THE COUNTER MEDICATION Take 1 lozenge by mouth daily as needed (nasal congestion). Eucalyptus lozenge  . sodium chloride (OCEAN) 0.65 % SOLN nasal spray Place 1 spray into both nostrils as needed for congestion.     Allergies:   Amoxicillin and Erythromycin   Social History   Socioeconomic History  . Marital status: Married    Spouse name: Not on file  . Number of children: Not on file  . Years of education: Not on file  . Highest education level: Not on file  Occupational History  . Not on file  Social Needs  . Financial resource  strain: Not on file  . Food insecurity:    Worry: Not on file    Inability: Not on file  . Transportation needs:    Medical: Not on file    Non-medical: Not on file  Tobacco Use  . Smoking status: Never Smoker  . Smokeless tobacco: Never Used  Substance and Sexual Activity  . Alcohol use: No    Comment: occassionally  . Drug use: No  . Sexual activity: Not on file  Lifestyle  . Physical activity:    Days per week: Not on file    Minutes per session: Not on file  . Stress: Not on file  Relationships  . Social connections:    Talks on phone: Not on file    Gets together: Not on file    Attends religious service: Not on file    Active member of club or organization: Not on file    Attends meetings of clubs or organizations: Not on file    Relationship status: Not on file  Other Topics Concern  . Not on file  Social History Narrative  . Not on file     Family History: [name] family history includes  Cancer in his father; Hypertension in his mother.  ROS:   Please see the history of present illness.    Profound weight loss.  No blood in his stool.  No fever.  He has had heat intolerance.  He has had shortness of breath.  Had increased anxiety.  All other systems reviewed and are negative.  EKGs/Labs/Other Studies Reviewed:    The following studies were reviewed today:   EKG:   NSR at 93.   Frequent PVCs   Recent Labs: 01/08/2018: BUN 24; Creatinine, Ser 1.33; Hemoglobin 16.6; Magnesium 2.3; Platelets 206; Potassium 4.0; Sodium 143  Recent Lipid Panel No results found for: CHOL, TRIG, HDL, CHOLHDL, VLDL, LDLCALC, LDLDIRECT  Physical Exam:    VS:  BP 116/68   Pulse 93   Ht 5\' 8"  (1.727 m)   Wt 169 lb (76.7 kg)   SpO2 98%   BMI 25.70 kg/m     Wt Readings from Last 3 Encounters:  01/28/18 169 lb (76.7 kg)     GEN:  Well nourished, well developed in no acute distress HEENT: Normal NECK: No JVD;  ? Palpable nodule right side of thyroid   LYMPHATICS: No  lymphadenopathy CARDIAC:  RR , frequent PVCs  RESPIRATORY:  Clear to auscultation without rales, wheezing or rhonchi  ABDOMEN: Soft, non-tender, non-distended MUSCULOSKELETAL:  No edema; No deformity  SKIN: Warm and dry NEUROLOGIC:  Alert and oriented x 3 PSYCHIATRIC:  Normal affect   ASSESSMENT:    1. PVC's (premature ventricular contractions)   2. Weight loss, unintentional   3. SOB (shortness of breath)    PLAN:    In order of problems listed above:  1. Palpitations: The patient presents with frequent premature ventricular contractions, profound weight loss, increased anxiety.  I suspect that he has hyperthyroidism.  Will check a TSH today.  Also check a PSA since he has not had one checked in quite a long time.  Him on Toprol-XL 25 mg a day.  Check an echocardiogram.  I have encouraged him to see his general medical doctor.  We will need him to see an endocrinologist if his thyroid is malfunctioning.  If it turns out that he does not have a thyroid issue, he will need to see his primary medical doctor for further work-up of this profound weight loss.   Medication Adjustments/Labs and Tests Ordered: Current medicines are reviewed at length with the patient today.  Concerns regarding medicines are outlined above.  Orders Placed This Encounter  Procedures  . TSH  . PSA  . EKG 12-Lead  . ECHOCARDIOGRAM COMPLETE   Meds ordered this encounter  Medications  . metoprolol succinate (TOPROL XL) 25 MG 24 hr tablet    Sig: Take 1 tablet (25 mg total) by mouth daily.    Dispense:  90 tablet    Refill:  3    Patient Instructions  Medication Instructions:  START Toprol XL (Metoprolol) 25 mg once daily   Labwork: TODAY - PSA, TSH   Testing/Procedures: Your physician has requested that you have an echocardiogram. Echocardiography is a painless test that uses sound waves to create images of your heart. It provides your doctor with information about the size and shape of your  heart and how well your heart's chambers and valves are working. This procedure takes approximately one hour. There are no restrictions for this procedure.   Follow-Up: Your physician recommends that you schedule a follow-up appointment in: 3 months with Dr. Acie Fredrickson   If you  need a refill on your cardiac medications before your next appointment, please call your pharmacy.   Thank you for choosing [facility]! Christen Bame, RN [phone]       Signed, Mertie Moores, MD  01/28/2018 3:49 PM    [place]

## 2018-01-29 LAB — TSH: TSH: 1.52 u[IU]/mL (ref 0.450–4.500)

## 2018-01-29 LAB — PSA: PROSTATE SPECIFIC AG, SERUM: 2 ng/mL (ref 0.0–4.0)

## 2018-02-04 DIAGNOSIS — R634 Abnormal weight loss: Secondary | ICD-10-CM | POA: Diagnosis not present

## 2018-02-04 DIAGNOSIS — R413 Other amnesia: Secondary | ICD-10-CM | POA: Diagnosis not present

## 2018-02-07 ENCOUNTER — Other Ambulatory Visit: Payer: Self-pay

## 2018-02-07 ENCOUNTER — Ambulatory Visit (HOSPITAL_COMMUNITY): Payer: Medicare HMO | Attending: Cardiology

## 2018-02-07 ENCOUNTER — Encounter (INDEPENDENT_AMBULATORY_CARE_PROVIDER_SITE_OTHER): Payer: Self-pay

## 2018-02-07 DIAGNOSIS — I071 Rheumatic tricuspid insufficiency: Secondary | ICD-10-CM | POA: Diagnosis not present

## 2018-02-07 DIAGNOSIS — R0602 Shortness of breath: Secondary | ICD-10-CM

## 2018-02-07 DIAGNOSIS — I10 Essential (primary) hypertension: Secondary | ICD-10-CM | POA: Diagnosis not present

## 2018-02-07 DIAGNOSIS — R634 Abnormal weight loss: Secondary | ICD-10-CM

## 2018-02-07 DIAGNOSIS — I493 Ventricular premature depolarization: Secondary | ICD-10-CM

## 2018-02-26 DIAGNOSIS — H35363 Drusen (degenerative) of macula, bilateral: Secondary | ICD-10-CM | POA: Diagnosis not present

## 2018-04-03 ENCOUNTER — Encounter: Payer: Self-pay | Admitting: Cardiovascular Disease

## 2018-05-01 ENCOUNTER — Encounter: Payer: Self-pay | Admitting: Cardiovascular Disease

## 2018-05-01 ENCOUNTER — Ambulatory Visit: Payer: Medicare HMO | Admitting: Cardiovascular Disease

## 2018-05-01 DIAGNOSIS — R002 Palpitations: Secondary | ICD-10-CM | POA: Diagnosis not present

## 2018-05-01 NOTE — Progress Notes (Signed)
Cardiology Office Note:    Date:  05/01/2018   ID:  Andrew Lester, DOB May 14, 1951, MRN [id number]  PCP:  Lawerance Cruel, MD  Cardiologist:  Mertie Moores, MD   Referring MD: No ref. provider found   Problem list 1.  Premature ventricular contractions 2.  Weight loss   Chief Complaint  Patient presents with  . Palpitations     History of Present Illness:    Andrew Lester is a 67 y.o. male with a hx of  Palpitations  He recently had some sinus congestion.   Was having some shortness of breath  Went to the ER .    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 Has some anxiety that causes him to feel shortness of breath .  Has lost lots of weight Has lost from 238 to 169 in 6 months .    Has not seen his primary MD for this weight loss .  Feels hotter than normal ,   Anxiety is worse recently .  No diarrhea  No blood in stool or tarry stool   May 01, 2018: Andrew Lester is seen today for follow-up visit.  He was seen last year with palpitations and profound weight loss. TSH  was normal Wt today is 177 lbs.  Exercising - they walk every day  2-3 miles a day     Past Medical History:  Diagnosis Date  . Hypertension     History reviewed. No pertinent surgical history.  Current Medications: Current Meds  Medication Sig  . fluticasone (FLONASE) 50 MCG/ACT nasal spray Place 2 sprays into both nostrils daily.  . metoprolol succinate (TOPROL XL) 25 MG 24 hr tablet Take 1 tablet (25 mg total) by mouth daily.  Marland Kitchen OVER THE COUNTER MEDICATION Take 1 lozenge by mouth daily as needed (nasal congestion). Eucalyptus lozenge  . sodium chloride (OCEAN) 0.65 % SOLN nasal spray Place 1 spray into both nostrils as needed for congestion.     Allergies:   Amoxicillin and  Erythromycin   Social History   Socioeconomic History  . Marital status: Married    Spouse name: Not on file  . Number of children: Not on file  . Years of education: Not on file  . Highest education level: Not on file  Occupational History  . Not on file  Social Needs  . Financial resource strain: Not on file  . Food insecurity:    Worry: Not on file    Inability: Not on file  . Transportation needs:    Medical: Not on file    Non-medical: Not on file  Tobacco Use  . Smoking status: Never Smoker  . Smokeless tobacco: Never Used  Substance and Sexual Activity  . Alcohol use: No    Comment: occassionally  . Drug use: No  . Sexual activity: Not on file  Lifestyle  . Physical activity:    Days per week: Not on file    Minutes per session: Not on file  . Stress: Not on file  Relationships  . Social connections:    Talks on phone: Not on file    Gets together: Not on file    Attends religious service: Not on  file    Active member of club or organization: Not on file    Attends meetings of clubs or organizations: Not on file    Relationship status: Not on file  Other Topics Concern  . Not on file  Social History Narrative  . Not on file     Family History: [name] family history includes Cancer in his father; Hypertension in his mother.  ROS:   Please see the history of present illness.    Profound weight loss.  No blood in his stool.  No fever.  He has had heat intolerance.  He has had shortness of breath.  Had increased anxiety.  All other systems reviewed and are negative.  EKGs/Labs/Other Studies Reviewed:    The following studies were reviewed today:       Recent Labs: 01/08/2018: BUN 24; Creatinine, Ser 1.33; Hemoglobin 16.6; Magnesium 2.3; Platelets 206; Potassium 4.0; Sodium 143 01/28/2018: TSH 1.520  Recent Lipid Panel No results found for: CHOL, TRIG, HDL, CHOLHDL, VLDL, LDLCALC, LDLDIRECT  Physical Exam: Blood pressure 132/70, pulse 64,  height 5\' 8"  (1.727 m), weight 177 lb 1.9 oz (80.3 kg), SpO2 98 %.  GEN:  Well nourished, well developed in no acute distress HEENT: Normal NECK: No JVD; No carotid bruits LYMPHATICS: No lymphadenopathy CARDIAC: RRR  RESPIRATORY:  Clear to auscultation without rales, wheezing or rhonchi  ABDOMEN: Soft, non-tender, non-distended MUSCULOSKELETAL:  No edema; No deformity  SKIN: Warm and dry NEUROLOGIC:  Alert and oriented x 3  EKG:     ASSESSMENT:    No diagnosis found. PLAN:    In order of problems listed above:  1.  Palpitations:   Seem to be better .  I would like for him to stay on the Toprol-XL 25 mg a day.  My standpoint I do not think that I need to see him on a regular basis.  We will ask if  Dr. Harrington Challenger  will assume responsibility for refilling his Toprol-XL.  Will see him as needed.    Medication Adjustments/Labs and Tests Ordered: Current medicines are reviewed at length with the patient today.  Concerns regarding medicines are outlined above.  No orders of the defined types were placed in this encounter.  No orders of the defined types were placed in this encounter.   Patient Instructions  Medication Instructions:  Your physician recommends that you continue on your current medications as directed. Please refer to the Current Medication list given to you today.  If you need a refill on your cardiac medications before your next appointment, please call your pharmacy.    Lab work: None Ordered  If you have labs (blood work) drawn today and your tests are completely normal, you will receive your results only by: Marland Kitchen MyChart Message (if you have MyChart) OR . A paper copy in the mail If you have any lab test that is abnormal or we need to change your treatment, we will call you to review the results.   Testing/Procedures: None Ordered   Follow-Up: At [facility], you and your health needs are our priority.  As part of our continuing mission to provide  you with exceptional heart care, we have created designated Provider Care Teams.  These Care Teams include your primary Cardiologist (physician) and Advanced Practice Providers (APPs -  Physician Assistants and Nurse Practitioners) who all work together to provide you with the care you need, when you need it. You will need a follow up appointment in:  As  needed. You may see Mertie Moores, MD or one of the following Advanced Practice Providers on your designated Care Team: Richardson Dopp, PA-C [place], [place] . Daune Perch, NP     Signed, Mertie Moores, MD  05/01/2018 10:51 AM    [place]

## 2018-05-01 NOTE — Patient Instructions (Signed)
Medication Instructions:  Your physician recommends that you continue on your current medications as directed. Please refer to the Current Medication list given to you today.  If you need a refill on your cardiac medications before your next appointment, please call your pharmacy.    Lab work: None Ordered  If you have labs (blood work) drawn today and your tests are completely normal, you will receive your results only by: Marland Kitchen MyChart Message (if you have MyChart) OR . A paper copy in the mail If you have any lab test that is abnormal or we need to change your treatment, we will call you to review the results.   Testing/Procedures: None Ordered   Follow-Up: At [facility], you and your health needs are our priority.  As part of our continuing mission to provide you with exceptional heart care, we have created designated Provider Care Teams.  These Care Teams include your primary Cardiologist (physician) and Advanced Practice Providers (APPs -  Physician Assistants and Nurse Practitioners) who all work together to provide you with the care you need, when you need it. You will need a follow up appointment in:  As needed. You may see Mertie Moores, MD or one of the following Advanced Practice Providers on your designated Care Team: Richardson Dopp, PA-C [place], [place] . Daune Perch, NP

## 2018-05-06 DIAGNOSIS — R413 Other amnesia: Secondary | ICD-10-CM | POA: Diagnosis not present

## 2018-05-06 DIAGNOSIS — R7301 Impaired fasting glucose: Secondary | ICD-10-CM | POA: Diagnosis not present

## 2018-05-06 DIAGNOSIS — D179 Benign lipomatous neoplasm, unspecified: Secondary | ICD-10-CM | POA: Diagnosis not present

## 2018-05-06 DIAGNOSIS — Z125 Encounter for screening for malignant neoplasm of prostate: Secondary | ICD-10-CM | POA: Diagnosis not present

## 2018-05-06 DIAGNOSIS — R972 Elevated prostate specific antigen [PSA]: Secondary | ICD-10-CM | POA: Diagnosis not present

## 2018-07-08 ENCOUNTER — Encounter: Payer: Self-pay | Admitting: *Deleted

## 2018-07-09 ENCOUNTER — Ambulatory Visit: Payer: Medicare HMO | Admitting: Neurology

## 2018-07-09 ENCOUNTER — Encounter: Payer: Self-pay | Admitting: Neurology

## 2018-07-09 ENCOUNTER — Telehealth: Payer: Self-pay | Admitting: Neurology

## 2018-07-09 VITALS — BP 138/83 | HR 71 | Ht 67.0 in | Wt 177.0 lb

## 2018-07-09 DIAGNOSIS — R635 Abnormal weight gain: Secondary | ICD-10-CM

## 2018-07-09 DIAGNOSIS — F0390 Unspecified dementia without behavioral disturbance: Secondary | ICD-10-CM | POA: Insufficient documentation

## 2018-07-09 DIAGNOSIS — R799 Abnormal finding of blood chemistry, unspecified: Secondary | ICD-10-CM | POA: Diagnosis not present

## 2018-07-09 DIAGNOSIS — G9340 Encephalopathy, unspecified: Secondary | ICD-10-CM | POA: Diagnosis not present

## 2018-07-09 DIAGNOSIS — R4189 Other symptoms and signs involving cognitive functions and awareness: Secondary | ICD-10-CM | POA: Diagnosis not present

## 2018-07-09 MED ORDER — DONEPEZIL HCL 5 MG PO TABS: 5.0000 mg | ORAL_TABLET | Freq: Every day | ORAL | 11 refills | Status: DC

## 2018-07-09 NOTE — Patient Instructions (Signed)
MRI of the brain Labs Formal Memory testing Clinical trials at [facility] and [facility] Compensation Strategies  1. Use "WARM" strategy.  W= write it down  A= associate it  R= repeat it  M= make a mental note  2.   You can keep a Social worker.  Use a 3-ring notebook with sections for the following: calendar, important names and phone numbers,  medications, doctors' names/phone numbers, lists/reminders, and a section to journal what you did  each day.   3.    Use a calendar to write appointments down.  4.    Write yourself a schedule for the day.  This can be placed on the calendar or in a separate section of the Memory Notebook.  Keeping a  regular schedule can help memory.  5.    Use medication organizer with sections for each day or morning/evening pills.  You may need help loading it  6.    Keep a basket, or pegboard by the door.  Place items that you need to take out with you in the basket or on the pegboard.  You may also want to  include a message board for reminders.  7.    Use sticky notes.  Place sticky notes with reminders in a place where the task is performed.  For example: " turn off the  stove" placed by the stove, "lock the door" placed on the door at eye level, " take your medications" on  the bathroom mirror or by the place where you normally take your medications.  8.    Use alarms/timers.  Use while cooking to remind yourself to check on food or as a reminder to take your medicine, or as a  reminder to make a call, or as a reminder to perform another task, etc.

## 2018-07-09 NOTE — Telephone Encounter (Signed)
[employer] medicare order sent to [facility]. They will obtain the auth and will reach out to the pt to schedule.  °

## 2018-07-09 NOTE — Progress Notes (Signed)
[facility]    Provider:  Dr Jaynee Eagles Referring Provider: Lawerance Cruel, MD Primary Care Provider:  Lawerance Cruel, MD  CC:  "keep forgetting things"  HPI:  Andrew Lester is a 68 y.o. male here as requested by provider Lawerance Cruel, MD for memory difficulties; mild progressive memory issues.  Past medical history memory difficulties, hypertension, erectile dysfunction, weight loss, prediabetes.  Patient's wife is with him today and provides much information.  Started about a year a year and a half ago right before he retired.  Keeps forgetting things.  He can pick something up turnaround and then lose it.  He forgets numbers and names.He says he was laid off due to workforce reduction and he did not want that. He can;t recall names immediately with people he has known for a long time and that he used to see regularly but has not seen in a year, eventually remembers. His wife says it is short-term memory, he might ask a question over and over in the same day. They walk about 2.5 miles each time and they count squirrels and he forgets how many they were on and asks a few times. He has set everything up automatically, any bills that come in patient pays online and he doesn't miss bills. Still driving not getting lost. Mother had dementia starting in her late 80s. She smoked. Patient has not smoked and no significant alcohol use. His social activity hasn't changed, he was never really social. No depression or mood changes. He misplaces things a lot. He sleeps well, no snoring, not fatigued during the day. They walk daily. No personality changes. No word finding problems or speech problems. He worked in the [employer] until 1998. No other focal neurologic deficits, associated symptoms, inciting events or modifiable factors.  Reviewed notes, labs and imaging from outside physicians, which showed:  Reviewed notes from Dr. Harrington Challenger.  Patient states getting more forgetful.  No  contention in the home or getting lost.  Wife notices that almost daily he asked the same question repeatedly.  Not complaining of injury or sudden drop-off in function.  No chest trouble shortness of breath.  Not complaining of urinary frequency or burning in the feet.  Recently abnormal fasting glucose.  Mother had Alzheimer's.  Hemoglobin A1c 6.  He also follows patient's thyroid, he has gone to the emergency room for regular heart rate and dehydration, had cardiology follow-up.  He is now retired and less active overall.  He walks in the evenings.  Review of Systems: Patient complains of symptoms per HPI as well as the following symptoms: Weight gain, double vision, memory loss, confusion, ringing in the ears, racing thoughts. Pertinent negatives and positives per HPI. All others negative.   Social History   Socioeconomic History  . Marital status: Married    Spouse name: Not on file  . Number of children: 4  . Years of education: 66  . Highest education level: High school graduate  Occupational History  . Not on file  Social Needs  . Financial resource strain: Not on file  . Food insecurity:    Worry: Not on file    Inability: Not on file  . Transportation needs:    Medical: Not on file    Non-medical: Not on file  Tobacco Use  . Smoking status: Never Smoker  . Smokeless tobacco: Never Used  Substance and Sexual Activity  . Alcohol use: Not Currently    Frequency: Never  Comment: past- limited, now none   . Drug use: Never  . Sexual activity: Not on file  Lifestyle  . Physical activity:    Days per week: Not on file    Minutes per session: Not on file  . Stress: Not on file  Relationships  . Social connections:    Talks on phone: Not on file    Gets together: Not on file    Attends religious service: Not on file    Active member of club or organization: Not on file    Attends meetings of clubs or organizations: Not on file    Relationship status: Not on file  .  Intimate partner violence:    Fear of current or ex partner: Not on file    Emotionally abused: Not on file    Physically abused: Not on file    Forced sexual activity: Not on file  Other Topics Concern  . Not on file  Social History Narrative   Lives at home with wife Amy   Right handed   Caffeine: once in awhile    Family History  Problem Relation Age of Onset  . Hypertension Mother   . CAD Mother   . Breast cancer Mother   . Alzheimer's disease Mother   . Osteoarthritis Mother   . Diabetes Mother   . Cancer Father        lung-smoker  . Hypertension Father   . Other Father        black lung    Past Medical History:  Diagnosis Date  . ED (erectile dysfunction)   . Gout   . Hypertension   . Kidney stones   . Prediabetes     Patient Active Problem List   Diagnosis Date Noted  . Cognitive deficits 07/09/2018  . Palpitations 05/01/2018    Past Surgical History:  Procedure Laterality Date  . COLONOSCOPY  08/10/2005    Current Outpatient Medications  Medication Sig Dispense Refill  . amLODipine (NORVASC) 5 MG tablet Take 5 mg by mouth daily as needed.    . fluticasone (FLONASE) 50 MCG/ACT nasal spray Place 2 sprays into both nostrils daily. (Patient taking differently: Place 2 sprays into both nostrils daily as needed. ) 16 g 2  . metoprolol succinate (TOPROL XL) 25 MG 24 hr tablet Take 1 tablet (25 mg total) by mouth daily. 90 tablet 3  . donepezil (ARICEPT) 5 MG tablet Take 1 tablet (5 mg total) by mouth at bedtime. 30 tablet 11  . OVER THE COUNTER MEDICATION Take 1 lozenge by mouth daily as needed (nasal congestion). Eucalyptus lozenge    . sodium chloride (OCEAN) 0.65 % SOLN nasal spray Place 1 spray into both nostrils as needed for congestion. (Patient not taking: Reported on 07/09/2018) 1 Bottle 0   No current facility-administered medications for this visit.     Allergies as of 07/09/2018 - Review Complete 07/09/2018  Allergen Reaction Noted  .  Amoxicillin Rash 07/22/2012  . Erythromycin Rash 07/22/2012    Vitals: BP 138/83 (BP Location: Right Arm, Patient Position: Sitting)   Pulse 71   Ht 5\' 7"  (1.702 m)   Wt 177 lb (80.3 kg)   BMI 27.72 kg/m  Last Weight:  Wt Readings from Last 1 Encounters:  07/09/18 177 lb (80.3 kg)   Last Height:   Ht Readings from Last 1 Encounters:  07/09/18 5\' 7"  (1.702 m)     Physical exam: Exam: Gen: NAD, conversant  CV: RRR, no MRG. No Carotid Bruits. No peripheral edema, warm, nontender Eyes: Conjunctivae clear without exudates or hemorrhage  Neuro: Detailed Neurologic Exam  Speech:    Speech is normal; fluent and spontaneous with normal comprehension.  Cognition:  MMSE - Mini Mental State Exam 07/09/2018  Orientation to time 5  Orientation to Place 5  Registration 3  Attention/ Calculation 3  Recall 3  Language- name 2 objects 2  Language- repeat 1  Language- follow 3 step command 3  Language- read & follow direction 1  Write a sentence 1  Copy design 0  Total score 27       The patient is oriented to person, place, and time;     recent and remote memory intact;     language fluent;     normal attention, concentration,     fund of knowledge Cranial Nerves:    The pupils are equal, round, and reactive to light. Attempted fundoscopy could not visualize due to small pupils. Visual fields are full to finger confrontation. Extraocular movements are intact. Trigeminal sensation is intact and the muscles of mastication are normal. The face is symmetric. The palate elevates in the midline. Hearing intact. Voice is normal. Shoulder shrug is normal. The tongue has normal motion without fasciculations.   Coordination:    Normal finger to nose and heel to shin.  Gait:    Heel-toe and tandem gait are normal.   Motor Observation:    No asymmetry, no atrophy, and no involuntary movements noted. Tone:    Normal muscle tone.    Posture:    Posture is normal.  normal erect    Strength:    Strength is V/V in the upper and lower limbs.      Sensation: intact to LT     Reflex Exam:  DTR's:    Deep tendon reflexes in the upper and lower extremities are symmetrical bilaterally.   Toes:    The toes are downgoing bilaterally.   Clonus:    Clonus is absent.    Assessment/Plan:  83 69 year old male who is here for cognitive complaints. He is recently retired Pensions consultant) and is raising his grandchildren so there is some stress. But given his mother's Alzheimers he is concerned. I think this is likely normal cognitive aging with stress factors or MCI but not dementia. Will complete workup. Patient very much wants to take something for memory, we can start Donepezil however I am not sure it is warranted at this point but given he feels strongly we can try a low dose.  Orders Placed This Encounter  Procedures  . MR BRAIN W WO CONTRAST  . Basic Metabolic Panel  . B12 and Folate Panel  . Methylmalonic acid, serum  . RPR  . Vitamin B1  . Vitamin B6  . Homocysteine  . TSH  . Ambulatory referral to Neuropsychology   Meds ordered this encounter  Medications  . donepezil (ARICEPT) 5 MG tablet    Sig: Take 1 tablet (5 mg total) by mouth at bedtime.    Dispense:  30 tablet    Refill:  11    Cc: Lawerance Cruel, MD,    Sarina Ill, MD  [facility] [address] [place], [place] [address]  Phone [phone] Fax [phone]

## 2018-07-11 DIAGNOSIS — K5000 Crohn's disease of small intestine without complications: Secondary | ICD-10-CM | POA: Diagnosis not present

## 2018-07-11 DIAGNOSIS — Z79899 Other long term (current) drug therapy: Secondary | ICD-10-CM | POA: Diagnosis not present

## 2018-07-15 LAB — BASIC METABOLIC PANEL
BUN/Creatinine Ratio: 11 (ref 10–24)
BUN: 11 mg/dL (ref 8–27)
CALCIUM: 9.7 mg/dL (ref 8.6–10.2)
CHLORIDE: 102 mmol/L (ref 96–106)
CO2: 25 mmol/L (ref 20–29)
Creatinine, Ser: 1.02 mg/dL (ref 0.76–1.27)
GFR calc Af Amer: 88 mL/min/{1.73_m2} (ref >59)
GFR, EST NON AFRICAN AMERICAN: 76 mL/min/{1.73_m2} (ref >59)
GLUCOSE: 115 mg/dL — AB (ref 65–99)
POTASSIUM: 4.7 mmol/L (ref 3.5–5.2)
SODIUM: 142 mmol/L (ref 134–144)

## 2018-07-15 LAB — RPR: RPR Ser Ql: NONREACTIVE

## 2018-07-15 LAB — VITAMIN B1: THIAMINE: 140.1 nmol/L (ref 66.5–200.0)

## 2018-07-15 LAB — METHYLMALONIC ACID, SERUM: Methylmalonic Acid: 438 nmol/L — ABNORMAL HIGH (ref 0–378)

## 2018-07-15 LAB — B12 AND FOLATE PANEL
FOLATE: 6.9 ng/mL (ref >3.0)
VITAMIN B 12: 436 pg/mL (ref 232–1245)

## 2018-07-15 LAB — TSH: TSH: 1.49 u[IU]/mL (ref 0.450–4.500)

## 2018-07-15 LAB — HOMOCYSTEINE: Homocysteine: 17.2 umol/L (ref 0.0–17.2)

## 2018-07-15 LAB — VITAMIN B6: Vitamin B6: 17.1 ug/L (ref 5.3–46.7)

## 2018-07-22 ENCOUNTER — Telehealth: Payer: Self-pay | Admitting: Neurology

## 2018-07-22 ENCOUNTER — Encounter: Payer: Self-pay | Admitting: Psychology

## 2018-07-22 NOTE — Telephone Encounter (Signed)
-----   Message from Melvenia Beam, MD sent at 07/16/2018  9:17 PM EST ----- Labs are unremarkable except for elevated methylmalonic acid. This is often due to vitamin B deficiency. I recommend an OTC multi-B vitamin and then checking it again. thanks

## 2018-07-22 NOTE — Telephone Encounter (Signed)
Called the patient and reviewed lab work with him. Informed him that Dr Jaynee Eagles didn't see anything concerning with lab results. Informed there was a elevated level that typically is related to having a vitamin b deficiency. Advised the recommendation from Dr Jaynee Eagles would be to start an over the counter b vitamin. Informed the patient this can be purchased over the counter in the vitamin sections and would need to include multiple b vitamin. Pt verbalized understanding.

## 2018-07-25 ENCOUNTER — Ambulatory Visit
Admission: RE | Admit: 2018-07-25 | Discharge: 2018-07-25 | Disposition: A | Discharge status: Home / Self Care | Payer: Medicare HMO | Source: Ambulatory Visit | Attending: Neurology | Admitting: Neurology

## 2018-07-25 ENCOUNTER — Other Ambulatory Visit: Payer: Medicare HMO

## 2018-07-25 DIAGNOSIS — R4189 Other symptoms and signs involving cognitive functions and awareness: Secondary | ICD-10-CM | POA: Diagnosis not present

## 2018-07-25 MED ORDER — GADOBENATE DIMEGLUMINE 529 MG/ML IV SOLN
15.0000 mL | Freq: Once | INTRAVENOUS | Status: AC | PRN
  Administered 2018-07-25: 15 mL via INTRAVENOUS

## 2018-07-28 ENCOUNTER — Telehealth: Payer: Self-pay | Admitting: *Deleted

## 2018-07-28 NOTE — Telephone Encounter (Signed)
Spoke with both pt. and wife and reviewed below MRI results; advised formal neuropsych testing still needed. Pt. verbalized understanding of same, c/o itchy, red, blister like rash just above both ankles and below both knees. Wife sts. rash started just after he started Donepezil. I asked her to have pt. stop the Donepezil and see if the rash improves, and to call us back in one week with an update. She verbalized understanding of same. I will let Dr. Jaynee Eagles know pt. is going to stop Donepezil for the next wk. to see if his new rash improves/fim

## 2018-07-28 NOTE — Telephone Encounter (Signed)
That's perfect, thank you!

## 2018-07-28 NOTE — Telephone Encounter (Signed)
-----   Message from Melvenia Beam, MD sent at 07/27/2018  8:16 PM EDT ----- MRI of the brain is unremarkable. This does not rule out dementia. Still need neuropsych formal memory testing thanks

## 2018-07-29 NOTE — Telephone Encounter (Signed)
Noted thank you

## 2018-10-09 ENCOUNTER — Other Ambulatory Visit: Payer: Self-pay | Admitting: Neurology

## 2018-10-09 DIAGNOSIS — R4189 Other symptoms and signs involving cognitive functions and awareness: Secondary | ICD-10-CM

## 2018-10-09 DIAGNOSIS — G9340 Encephalopathy, unspecified: Secondary | ICD-10-CM

## 2018-10-14 ENCOUNTER — Telehealth: Payer: Self-pay | Admitting: *Deleted

## 2018-10-14 NOTE — Telephone Encounter (Signed)
April returned his call.

## 2018-10-17 ENCOUNTER — Encounter: Payer: Medicare HMO | Admitting: Psychology

## 2018-12-08 DIAGNOSIS — Z03818 Encounter for observation for suspected exposure to other biological agents ruled out: Secondary | ICD-10-CM | POA: Diagnosis not present

## 2019-01-07 ENCOUNTER — Ambulatory Visit (INDEPENDENT_AMBULATORY_CARE_PROVIDER_SITE_OTHER): Payer: Self-pay | Admitting: Neurology

## 2019-01-07 ENCOUNTER — Telehealth: Payer: Self-pay | Admitting: *Deleted

## 2019-01-07 ENCOUNTER — Other Ambulatory Visit: Payer: Self-pay

## 2019-01-07 DIAGNOSIS — R4189 Other symptoms and signs involving cognitive functions and awareness: Secondary | ICD-10-CM

## 2019-01-07 NOTE — Telephone Encounter (Signed)
The pt arrived for his follow-up appointment today however the formal memory testing is still pending for next month (was r/s from July). Pt and wife prefer to wait until after the testing is completed for follow-up with Dr. Jaynee Eagles who is aware and agrees with plan. Pt will remain off the Donepezil for now. They will call to r/s this appt when they know when testing will be fully completed/results reviewed with neuropsych. MRI results reviewed today per wife's request. They verbalized appreciation.

## 2019-01-08 NOTE — Progress Notes (Signed)
Patient left, we will reschedule after he has formal memory testing completed, NOT a no show

## 2019-01-29 ENCOUNTER — Encounter: Payer: Self-pay | Admitting: Psychology

## 2019-01-29 ENCOUNTER — Other Ambulatory Visit: Payer: Self-pay

## 2019-01-29 ENCOUNTER — Encounter: Payer: Medicare HMO | Attending: Psychology | Admitting: Psychology

## 2019-01-29 ENCOUNTER — Encounter

## 2019-01-29 DIAGNOSIS — I10 Essential (primary) hypertension: Secondary | ICD-10-CM | POA: Insufficient documentation

## 2019-01-29 DIAGNOSIS — G3109 Other frontotemporal dementia: Secondary | ICD-10-CM | POA: Diagnosis present

## 2019-01-29 DIAGNOSIS — Z820 Family history of epilepsy and other diseases of the nervous system: Secondary | ICD-10-CM | POA: Diagnosis not present

## 2019-01-29 DIAGNOSIS — Z8249 Family history of ischemic heart disease and other diseases of the circulatory system: Secondary | ICD-10-CM | POA: Insufficient documentation

## 2019-01-29 DIAGNOSIS — G319 Degenerative disease of nervous system, unspecified: Secondary | ICD-10-CM | POA: Diagnosis not present

## 2019-01-29 DIAGNOSIS — R7303 Prediabetes: Secondary | ICD-10-CM | POA: Diagnosis not present

## 2019-01-29 DIAGNOSIS — F0280 Dementia in other diseases classified elsewhere without behavioral disturbance: Secondary | ICD-10-CM | POA: Diagnosis present

## 2019-01-29 DIAGNOSIS — R4189 Other symptoms and signs involving cognitive functions and awareness: Secondary | ICD-10-CM | POA: Insufficient documentation

## 2019-01-29 DIAGNOSIS — Z833 Family history of diabetes mellitus: Secondary | ICD-10-CM | POA: Diagnosis not present

## 2019-01-29 DIAGNOSIS — Z803 Family history of malignant neoplasm of breast: Secondary | ICD-10-CM | POA: Diagnosis not present

## 2019-01-29 DIAGNOSIS — R413 Other amnesia: Secondary | ICD-10-CM | POA: Diagnosis not present

## 2019-01-29 NOTE — Progress Notes (Signed)
Neuropsychological Consultation   Patient:   Andrew Lester   DOB:   11/10/1950  MR Number:  [id number]  Location:  [facility] FOR PAIN AND [facility] MEDICINE [facility] PHYSICAL MEDICINE AND REHABILITATION [place], [place] [id number] [place] [address] Dept: [phone]           Date of Service:   01/29/2019  Today's visit was an in person visit that consisted of myself, the patient and his wife.  It was a 1 hour face-to-face period of time as well as 1 hour spent with records review, report writing and setting up testing protocols.  Start Time:   10 AM End Time:   12 PM  Provider/Observer:  Ilean Skill, Psy.D.       Clinical Neuropsychologist       Billing Code/Service: [phone], [phone]  Chief Complaint:    [name] is a 68 year old male referred by Dr. Jaynee Eagles for neuropsychological evaluation due to memory difficulties and memory loss concerns.  The patient was initially referred to Dr. Lavell Anchors by his PCP Lona Kettle, MD.  The patient does have prior medical history of memory difficulties being reported, hypertension, weight loss and prediabetes.  The patient reports that he began experiencing what he describes as his memory difficulties a little bit over 2 years ago and he started noticing forgetting what he was going for when he would go to another room or having other short-term memory issues develop.  The patient denies any geographic disorientation or expressive language changes.  Reason for Service:  [name] is a 68 year old male referred by Dr. Jaynee Eagles for neuropsychological evaluation due to memory difficulties and memory loss concerns.  The patient was initially referred to Dr. Lavell Anchors by his PCP Lona Kettle, MD.  The patient does have prior medical history of memory difficulties being reported, hypertension, weight loss and prediabetes.  The patient reports that he began experiencing what he describes as his memory difficulties a little  bit over 2 years ago and he started noticing forgetting what he was going for when he would go to another room or having other short-term memory issues develop.  The patient denies any geographic disorientation or expressive language changes.  The patient reports a now 2-year history of forgetting things are putting things down and forgetting where he placed him.  The patient reports that he was having the symptoms even before he was laid off from work due to staffing issues.  The patient was not planning on retiring when they did the staffing issues and they were not directed at him individually.  [name] says that she is also witnessed the short-term issues and that the patient will ask the question over and over during the same day.  The patient and his wife get regular physical activity walking about 2-1/2 miles each day.  The patient has not been having difficulty with bills as everything is set up automatically pays them online and does not miss paying bills.  The patient has had no difficulties with getting lost while driving.  The patient denies any changes in personality, expressive language changes or social interactions/social patterns.  The patient does describe a history with his mother developing dementia starting in her late 20s.  However, he reports that she did not pass away and tell her mid 60s but by the time she was in her 40s she could not remember who various family members were.  The patient does have some anxiety or worry that  what happened to his mother may be happening to him even though she spent nearly 34 years and a slow deteriorated state and the mother had significant diabetes and her likely dementia was from cerebral vascular issues.    The patient did report that he had some concerns that possibly his past work history may have played a role in some of his cognitive situations.  The patient reports that he worked in a [employer] for many years until 1998.  The patient  denies having any type of lung issues or lung damage and does not have black lung disease and always followed OSHA guidelines.  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  The only metabolic issue that he is dealing with is prediabetes and he is addressing that by weight loss, exercise and changes in diet.  The patient had an MRI of the brain completed on 07/25/2018.  The impression produced by Dr. Felecia Shelling was 1 of mild generalized cortical atrophy typical for age, minimal chronic microvascular ischemic change which was also typical for age.  There are also signs of chronic sinusitis.  Current Status:  The patient describes short-term memory difficulties related to forgetting what he is going for when he goes from one room to another or laying things down and forgetting where they are.    Reliability of Information: The information is derived from 1 hour face-to-face interview with the patient and his wife as well as review of available medical records.  Behavioral Observation: Hervin Duch  presents as a 68 y.o.-year-old Right Caucasian Male who appeared his stated age. his dress was Appropriate and he was Well Groomed and his manners were Appropriate to the situation.  his participation was indicative of Appropriate and Redirectable behaviors.  There were not any physical disabilities noted.  he displayed an appropriate level of cooperation and motivation.     Interactions:    Active Appropriate and Redirectable  Attention:   within normal limits and although the patient did show some distraction due to internal preoccupations and concerns but this may not of been a constant state but the patient morning to ask questions during the face-to-face interaction.  Memory:   abnormal; the patient reports difficulties with short-term memory and free recall of information that have been persistent  for the past 2 years or so.  Visuo-spatial:  not examined  Speech (Volume):  normal  Speech:   normal; normal  Thought Process:  Coherent and Relevant  Though Content:  WNL; not suicidal and not homicidal  Orientation:   person, place, time/date and situation  Judgment:   Good  Planning:   Good  Affect:    Anxious  Mood:    Anxious  Insight:   Good  Intelligence:   normal  Marital Status/Living: The patient was born in [place] with 4 siblings.  He currently lives with his wife of 18 years.  He has no previous marriages.  The patient and his wife have 4 children aged 34, 71, 10, and 79.  No significant neurological or psychiatric history are noted with the children.  Current Employment: The patient is retired.  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  Substance Use:  No concerns of substance abuse are reported.    Education:   HS Graduate  Medical History:  Past Medical History:  Diagnosis Date  . ED (erectile dysfunction)   . Gout   . Hypertension   . Kidney stones   . Prediabetes             Abuse/Trauma History: The patient did not report or describe any significant issues or traumatic history.  Psychiatric History:  The patient denies any prior psychiatric history.  Family Med/Psych History:  Family History  Problem Relation Age of Onset  . Hypertension Mother   . CAD Mother   . Breast cancer Mother   . Alzheimer's disease Mother   . Osteoarthritis Mother   . Diabetes Mother   . Cancer Father        lung-smoker  . Hypertension Father   . Other Father        black lung    Risk of Suicide/Violence: virtually non-existent the patient denies any suicidal or homicidal ideation.  Impression/DX:  Nehemyah Wise is a 68 year old male referred by Dr. Jaynee Eagles for neuropsychological evaluation due to memory difficulties and memory loss concerns.  The patient was initially  referred to Dr. Lavell Anchors by his PCP Lona Kettle, MD.  The patient does have prior medical history of memory difficulties being reported, hypertension, weight loss and prediabetes.  The patient reports that he began experiencing what he describes as his memory difficulties a little bit over 2 years ago and he started noticing forgetting what he was going for when he would go to another room or having other short-term memory issues develop.  The patient denies any geographic disorientation or expressive language changes.  The patient describes short-term memory difficulties related to forgetting what he is going for when he goes from one room to another or laying things down and forgetting where they are.   Disposition/Plan:  We have set the patient up for formal neuropsychological evaluation that will consist of the Wechsler Adult Intelligence Scale-IV as well as the Wechsler Memory Scale-IV.  Diagnosis:    Perceived Cognitive deficits         Electronically Signed   _______________________ Ilean Skill, Psy.D.

## 2019-01-30 ENCOUNTER — Encounter: Payer: Medicare HMO | Admitting: Psychology

## 2019-01-30 ENCOUNTER — Encounter: Payer: Self-pay | Admitting: Psychology

## 2019-01-30 DIAGNOSIS — G319 Degenerative disease of nervous system, unspecified: Secondary | ICD-10-CM | POA: Diagnosis not present

## 2019-01-30 DIAGNOSIS — Z8249 Family history of ischemic heart disease and other diseases of the circulatory system: Secondary | ICD-10-CM | POA: Diagnosis not present

## 2019-01-30 DIAGNOSIS — Z820 Family history of epilepsy and other diseases of the nervous system: Secondary | ICD-10-CM | POA: Diagnosis not present

## 2019-01-30 DIAGNOSIS — R4189 Other symptoms and signs involving cognitive functions and awareness: Secondary | ICD-10-CM | POA: Diagnosis not present

## 2019-01-30 DIAGNOSIS — I10 Essential (primary) hypertension: Secondary | ICD-10-CM | POA: Diagnosis not present

## 2019-01-30 DIAGNOSIS — Z833 Family history of diabetes mellitus: Secondary | ICD-10-CM | POA: Diagnosis not present

## 2019-01-30 DIAGNOSIS — R413 Other amnesia: Secondary | ICD-10-CM | POA: Diagnosis not present

## 2019-01-30 DIAGNOSIS — R7303 Prediabetes: Secondary | ICD-10-CM | POA: Diagnosis not present

## 2019-01-30 DIAGNOSIS — Z803 Family history of malignant neoplasm of breast: Secondary | ICD-10-CM | POA: Diagnosis not present

## 2019-01-30 NOTE — Progress Notes (Signed)
The patient arrived on time to his 13:00 testing appointment and was accompanied by his wife. The testing session lasted 120 minutes.  Behavioral Observations:  Appearance: Casually and appropriately dressed with good hygiene. Gait: Ambulated independently without assistance.  Speech: Normal rate, tone & volume Thought process:  Mostly Logical, somewhat impulsive with concrete thinking noted.  Mood/Affect: Mild-moderately anxious, appropriate range.  Interpersonal: Polite and appropriate.  Orientation: O x 4  Effort/Motivation: Good   He did not appear to have any difficulty hearing, seeing, or understanding test items. He exhibited good distress tolerance on questions he did not know or tasks that were more difficult. He was cooperative with all assigned tasks and persisted well throughout the evaluation. Next testing session scheduled (2 hours) for 9/18 to complete the Wechsler Memory Scale, 4th edition, Older Adult Battery (WMS-IV-OA).   Tests Administered . Wechsler Adult Intelligence Scale, 4th Edition (WAIS-IV)  Results:  To come once administration and scoring are incomplete.

## 2019-02-06 ENCOUNTER — Encounter: Payer: Medicare HMO | Admitting: Psychology

## 2019-02-06 ENCOUNTER — Other Ambulatory Visit: Payer: Self-pay

## 2019-02-06 ENCOUNTER — Encounter: Payer: Self-pay | Admitting: Psychology

## 2019-02-06 DIAGNOSIS — G319 Degenerative disease of nervous system, unspecified: Secondary | ICD-10-CM | POA: Diagnosis not present

## 2019-02-06 DIAGNOSIS — Z8249 Family history of ischemic heart disease and other diseases of the circulatory system: Secondary | ICD-10-CM | POA: Diagnosis not present

## 2019-02-06 DIAGNOSIS — I10 Essential (primary) hypertension: Secondary | ICD-10-CM | POA: Diagnosis not present

## 2019-02-06 DIAGNOSIS — R413 Other amnesia: Secondary | ICD-10-CM | POA: Diagnosis not present

## 2019-02-06 DIAGNOSIS — R4189 Other symptoms and signs involving cognitive functions and awareness: Secondary | ICD-10-CM

## 2019-02-06 DIAGNOSIS — R7303 Prediabetes: Secondary | ICD-10-CM | POA: Diagnosis not present

## 2019-02-06 DIAGNOSIS — Z803 Family history of malignant neoplasm of breast: Secondary | ICD-10-CM | POA: Diagnosis not present

## 2019-02-06 DIAGNOSIS — Z833 Family history of diabetes mellitus: Secondary | ICD-10-CM | POA: Diagnosis not present

## 2019-02-06 DIAGNOSIS — Z820 Family history of epilepsy and other diseases of the nervous system: Secondary | ICD-10-CM | POA: Diagnosis not present

## 2019-02-06 NOTE — Progress Notes (Signed)
The patient arrived on time to his 10:00 testing appointment and was accompanied by his wife. The testing session lasted 120 minutes.  Behavioral Observations:  Appearance: Casually and appropriately dressed with good hygiene. Gait: Ambulated independently without assistance.  Speech: Normal rate, tone & volume Thought process:  Mostly logical, somewhat disorganized and concrete. Perseveration noted.  Mood/Affect: Mild-moderately anxious, appropriate range.  Interpersonal: Polite and appropriate.  Orientation: Incorrect date (e.g. "01/29/2019)  Effort/Motivation: Adequate   He did not appear to have difficulty hearing, seeing (e.g. wore reading glasses), or understanding test items. He exhibited reduced distress tolerance on questions (e.g. e.g. particularly learning and memory items) he did not know or tasks that were more difficult. He became frustrated after the immediate recall trials of all verbal learning and memory tasks on the Wechsler Memory Scale (e.g. LM-I, VPA-I).   Tests Administered: Marland Kitchen Modified [employer] (M-WCST) . Test of Memory Malingering (TOMM) . Trail Making Test (Part A & B)  . Wechsler Adult Intelligence Scale, 4th Edition (WAIS-IV) . Wechsler Memory Scale, 4th edition, Older Adult Battery (WMS-IV-OA).   Results:  M-WCST . Number of Categories Correct (Raw=1, ss=3, T=19, <1st %) . Number of Perseverative Errors (Raw=14, ss=4, T=33, 4th %) . Number of Total Errors (Raw=28, ss=4, T=33, 4th %) . Percent of Perseverative Errors (Raw=50, ss=7, T=44, 27th %) . Executive Function Composite (SS=60, <1st %)  TOMM Trial 1  . Total Correct= 47/50; WNL    Trail Making Test  . Part A (Time=41.87", T=44, 27th %) . Part B (Time=105.23", T=44, 27th %")  WAIS-IV  Composite Score Summary  Scale Sum of Scaled Scores Composite Score Percentile Rank 95% Conf. Interval Qualitative Description  Verbal Comprehension 27 VCI 95 37 90-101 Average  Perceptual  Reasoning 27 PRI 94 34 88-101 Average  Working Memory 18 WMI 95 37 89-102 Average  Processing Speed 11 PSI 76 5 70-87 Borderline  Full Scale 83 FSIQ 88 21 84-92 Low Average  General Ability 54 GAI 94 34 89-99 Average   Index Level Discrepancy Comparisons  Comparison Score 1 Score 2 Difference Critical Value .05 Significant Difference Y/N Base Rate by Overall Sample  VCI - PRI 95 94 1 8.31 N 48.2  VCI - WMI 95 95 0 8.82 N   VCI - PSI 95 76 19 10.19 Y 12.5  PRI - WMI 94 95 -1 9.74 N 48.0  PRI - PSI 94 76 18 11.00 Y 12.1  WMI - PSI 95 76 19 11.38 Y 10.8  FSIQ - GAI 88 94 -6 3.08 Y 12.4   Differences Between Subtest and Overall Mean of Subtest Scores  Subtest Subtest Scaled Score Mean Scaled Score Difference Critical Value .05 Strength or Weakness Base Rate  Matrix Reasoning 11 8.30 2.70 2.54 S 15-25%  Vocabulary 11 8.30 2.70 2.03 S 15%  Symbol Search 4 8.30 -4.30 3.42 W 5-10%  Information 6 8.30 -2.30 2.19 W 25%   WMS-IV-OA Brief Cognitive Status Exam Classification  Age Years of Education Raw Score Classification Level Base Rate  68 years 1 month 12 42 Low 3.5   Index Score Summary  Index Sum of Scaled Scores Index Score Percentile Rank 95% Confidence Interval Qualitative Descriptor  Auditory Memory (AMI) 13 58 0.3 54-66 Extremely Low  Visual Memory (VMI) 11 76 5 72-82 Borderline  Immediate Memory (IMI) 13 65 1 61-73 Extremely Low  Delayed Memory (DMI) 11 61 0.5 56-72 Extremely Low   Primary Subtest Scaled Score Summary  Subtest Domain  Raw Score Scaled Score Percentile Rank  Logical Memory I AM 12 3 1   Logical Memory II AM 1 1 0.1  Verbal Paired Associates I AM 7 5 5   Verbal [facility] [facility] II AM 2 4 2   Visual Reproduction I VM 21 5 5   Visual Reproduction II VM 9 6 9   Symbol Span VWM 17 9 37   Auditory Memory Process Score Summary  Process Score Raw Score Scaled Score Percentile Rank Cumulative Percentage (Base Rate)  LM II Recognition 21 - - >75%  VPA II  Recognition 23 - - 3-9%   Visual Memory Process Score Summary  Process Score Raw Score Scaled Score Percentile Rank Cumulative Percentage (Base Rate)  VR II Recognition 4 - - 26-50%   ABILITY-MEMORY ANALYSIS  Ability Score:  GAI: 94 Date of Testing:  WAIS-IV 2019/01/30; WMS-IV 2019/02/06  Predicted Difference Method   Index Predicted WMS-IV Index Score Actual WMS-IV Index Score Difference Critical Value  Significant Difference Y/N Base Rate  Auditory Memory 97 58 39 9.22 Y <1%  Visual Memory 96 76 20 8.25 Y 5-10%  Immediate Memory 96 65 31 10.13 Y <1%  Delayed Memory 97 61 36 11.43 Y <1%  Statistical significance (critical value) at the .01 level.   Contrast Scaled Scores  Score Score 1 Score 2 Contrast Scaled Score  General Ability Index vs. Auditory Memory Index 94 58 1  General Ability Index vs. Visual Memory Index 94 76 5  General Ability Index vs. Immediate Memory Index 94 65 2  General Ability Index vs. Delayed Memory Index 94 61 1  Verbal Comprehension Index vs. Auditory Memory Index 94 58 1  Perceptual Reasoning Index vs. Visual Memory Index 95 76 4  Working Memory Index vs. Auditory Memory Index 94 58 1

## 2019-02-10 ENCOUNTER — Encounter: Payer: Self-pay | Admitting: Psychology

## 2019-02-10 ENCOUNTER — Other Ambulatory Visit: Payer: Self-pay

## 2019-02-10 ENCOUNTER — Encounter (HOSPITAL_BASED_OUTPATIENT_CLINIC_OR_DEPARTMENT_OTHER): Payer: Medicare HMO | Admitting: Psychology

## 2019-02-10 DIAGNOSIS — G319 Degenerative disease of nervous system, unspecified: Secondary | ICD-10-CM | POA: Diagnosis not present

## 2019-02-10 DIAGNOSIS — G3109 Other frontotemporal dementia: Secondary | ICD-10-CM | POA: Diagnosis not present

## 2019-02-10 DIAGNOSIS — R4189 Other symptoms and signs involving cognitive functions and awareness: Secondary | ICD-10-CM | POA: Diagnosis not present

## 2019-02-10 DIAGNOSIS — R7303 Prediabetes: Secondary | ICD-10-CM | POA: Diagnosis not present

## 2019-02-10 DIAGNOSIS — R413 Other amnesia: Secondary | ICD-10-CM | POA: Diagnosis not present

## 2019-02-10 DIAGNOSIS — Z8249 Family history of ischemic heart disease and other diseases of the circulatory system: Secondary | ICD-10-CM | POA: Diagnosis not present

## 2019-02-10 DIAGNOSIS — Z820 Family history of epilepsy and other diseases of the nervous system: Secondary | ICD-10-CM | POA: Diagnosis not present

## 2019-02-10 DIAGNOSIS — F0280 Dementia in other diseases classified elsewhere without behavioral disturbance: Secondary | ICD-10-CM | POA: Diagnosis not present

## 2019-02-10 DIAGNOSIS — Z803 Family history of malignant neoplasm of breast: Secondary | ICD-10-CM | POA: Diagnosis not present

## 2019-02-10 DIAGNOSIS — I10 Essential (primary) hypertension: Secondary | ICD-10-CM | POA: Diagnosis not present

## 2019-02-10 DIAGNOSIS — Z833 Family history of diabetes mellitus: Secondary | ICD-10-CM | POA: Diagnosis not present

## 2019-02-10 NOTE — Progress Notes (Addendum)
Neuropsychological Evaluation  Patient:  Andrew Lester   DOB: 10-08-1950  MR Number: [id number]  Location: [facility] FOR PAIN AND REHABILITATIVE MEDICINE [facility] PHYSICAL MEDICINE AND REHABILITATION [place], [place] [id number] [place] [id number] Dept: [phone]  Start: 11 AM End: 12 PM  Provider/Observer:     Andrew Roys PsyD  Chief Complaint:      Chief Complaint  Patient presents with  . Memory Loss    Reason For Service:      Andrew Lester is a 68 year old male referred by Dr. Jaynee Lester for neuropsychological evaluation due to memory difficulties and memory loss concerns.  The patient was initially referred to Dr. Jaynee Lester by his PCP Andrew Kettle, MD.  The patient does have prior medical history of memory difficulties being reported, hypertension, weight loss and prediabetes.  The patient reports that he began experiencing what he describes as his memory difficulties a little bit over 2 years ago and he started noticing forgetting what he was going for when he would go to another room or having other short-term memory issues develop.  The patient denies any geographic disorientation or expressive language changes.  The patient reports a now 2-year history of forgetting things are putting things down and forgetting where he placed him.  The patient reports that he was having the symptoms even before he was laid off from work due to staffing issues.  The patient was not planning on retiring when they did the staffing issues and they were not directed at him individually.  [name] says that she is also witnessed the short-term issues and that the patient will ask the question over and over during the same day.  The patient and his wife get regular physical activity walking about 2-1/2 miles each day.  The patient has not been having difficulty with bills as everything is set up automatically pays them online and does not miss paying bills.  The  patient has had no difficulties with getting lost while driving.  The patient denies any changes in personality, expressive language changes or social interactions/social patterns.  The patient does describe a history with his mother developing dementia starting in her late 10s.  However, he reports that she did not pass away and tell her mid 32s but by the time she was in her 55s she could not remember who various family members were.  The patient does have some anxiety or worry that what happened to his mother may be happening to him even though she spent nearly 79 years and a slow deteriorated state and the mother had significant diabetes and her likely dementia was from cerebral vascular issues.    The patient did report that he had some concerns that possibly his past work history may have played a role in some of his cognitive situations.  The patient reports that he worked in a [place] for many years until 1998.  The patient denies having any type of lung issues or lung damage and does not have black lung disease and always followed OSHA guidelines.  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  The only metabolic issue that he is dealing with is prediabetes and he is addressing that by weight loss, exercise and changes in diet.  The patient had an MRI of the brain completed on 07/25/2018.  The impression produced by Dr.  Sater was 1 of mild generalized cortical atrophy typical for age, minimal chronic microvascular ischemic change which was also typical for age.  There are also signs of chronic sinusitis.  Behavioral Observations: Appearance:Casually and appropriately dressed with good hygiene. Gait:Ambulated independently without assistance.  Speech:Normal rate, tone & volume Thought process: Mostly logical, somewhat disorganized and concrete. Perseveration noted.   Mood/Affect:Mild-moderately anxious, appropriate range.  Interpersonal: Polite and appropriate.  Orientation: Incorrect date (e.g. "01/29/2019)  Effort/Motivation: Adequate   He did not appear to have difficulty hearing, seeing (e.g. wore reading glasses), or understanding test items. He exhibited reduced distress tolerance on questions (e.g. e.g. particularly learning and memory items) he did not know or tasks that were more difficult. He became frustrated after the immediate recall trials of all verbal learning and memory tasks on the Wechsler Memory Scale (e.g. LM-I, VPA-I).   Tests Administered:  Modified [employer] (M-WCST)  Test of Memory Malingering (TOMM)  Trail Making Test (Part A & B)   Wechsler Adult Intelligence Scale, 4th Edition (WAIS-IV)  Wechsler Memory Scale, 4th edition, Older Adult Battery (WMS-IV-OA).    Test Results:   The patient appeared to approach this testing procedure in a straightforward manner and did not appear to attempt to exaggerate or minimize any type of cognitive symptomatology.  The patient did have some distress when faced with tasks that he felt that he had difficulty or more difficulty than he was expecting.  The patient did have times where his frustration may have negatively impact some of his memory performances but not to a significant degree.  He did appear to try his hardest throughout this testing procedures.  TOMM Trial 1   Total Correct= 47/50; WNL   Because the patient showed significant deficits on memory task and also showed significant difficulties with frustration tolerance that may have negatively affected some of his performances the patient was administered the TOMM, which is a test of memory malingering or lack of motivation on memory test.  The patient produced a performance that was well within normal limits and does not indicate that his frustration intolerance was related to motivation or effort at all.  It  does appear that the memory measures that will be documented below are a fair and accurate representation of his memory functioning.  Given the patient's educational and occupational history it is estimated that historically/premorbidly the patient was performing her would have been performing in the average range relative to a normative population.  The patient graduated from high school and worked in various careers including working in [employer] as well as working in a [employer].  I would predict his general abilities index score to be around 100 given this historical information.  WAIS-IV  Composite Score Summary   Scale Sum of Scaled Scores Composite Score Percentile Rank 95% Conf. Interval Qualitative Description  Verbal Comprehension 27 VCI 95 37 90-101 Average  Perceptual Reasoning 27 PRI 94 34 88-101 Average  Working Memory 18 WMI 95 37 89-102 Average  Processing Speed 11 PSI 76 5 70-87 Borderline  Full Scale 83 FSIQ 88 21 84-92 Low Average  General Ability 54 GAI 94 34 89-99 Average   The patient was initially administered the Wechsler Adult Intelligence Scale-IV.  The patient produced a full scale IQ score of 88 which falls at the 21st percentile and is in the low average range of functioning relative to a normative population.  This is 12 points below predicted levels and does suggest some areas  of cognitive function that may be problematic and impaired relative to premorbid functioning.  We also calculated the patient's general abilities index score which is a measure that places less emphasis on the most sensitive elements to change including working memory/auditory encoding as well as information processing speed.  The patient produced a general abilities index score of 94 which falls at the 34th percentile and is in the average range.  This performance is just slightly below what would predicted based on his educational and occupational history.  The patient did clearly have  difficulty on issues of information processing speed where he performed in the 5th percentile and this was indicative of significant impairment and brought down his overall global performances.  Verbal Comprehension Subtests Summary  Subtest Raw Score Scaled Score Percentile Rank Reference Group Scaled Score SEM  Similarities 25 10 50 10 1.04  Vocabulary 43 11 63 12 0.67  Information 7 6 9 6  0.73  (Comprehension) 19 8 25 8  1.08   The patient produced a verbal comprehension index score of 95 which falls at the 37th percentile and is in the average range.  This is generally consistent with what be predicted based on historical information.  The patient performed in the average range with regard to verbal reasoning and problem-solving skills, his vocabulary knowledge as well as his social judgment and comprehension abilities.  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   Perceptual Reasoning Subtests Summary  Subtest Raw Score Scaled Score Percentile Rank Reference Group Scaled Score SEM  Block Design 24 8 25 6  1.08  Matrix Reasoning 16 11 63 8 0.90  Visual Puzzles 9 8 25 6  0.85  (Figure Weights) 14 12 75 9 0.95  (Picture Completion) 6 6 9 5  1.16   The patient produced a perceptual reasoning index score of 94 which falls at the 34th percentile and is in the average range.  There was some variability within subtest performances.  The patient did quite well on his measures of visual estimation and judgment, visual reasoning and problem-solving, and performed in the average range with regard to visual analysis and organization abilities and visual problem solving.  The patient had some  difficulty identifying visual anomalies within a gestalt.  Overall, there does not appear to be any significant change in visual spatial perceptual reasoning abilities.  Working Doctor, general practice Raw Score Scaled Score Percentile Rank Reference Group Scaled Score SEM  Digit Span 22 8 25 7  0.79  Arithmetic 14 10 50 10 0.99  (Letter-Number Seq.) 17 9 37 8 1.04    The patient produced a working memory index score of 95 which falls at the 37th percentile and is in the average range.  Individual subtest making up this measure were all in the average range with little variability noted.  The patient performed in the average range with regard to pure auditory encoding, more complex auditory encoding with processing abilities.   Processing Speed Subtests Summary  Subtest Raw Score Scaled Score Percentile Rank Reference Group Scaled Score SEM  Symbol Search 12 4 2 3  1.31  Coding 36 7 16 4  0.99  (Cancellation) 24 6 9 5  1.34    The patient produced a processing speed  index score of 76 which falls at the 5th percentile and is in the borderline range of functioning and indicative of significant deficits relative to predicted levels.  The patient showed particular difficulty with measures of visual scanning, visual searching and overall speed of mental operations.  This was a consistent finding across 3 separate measures on this particular battery.  Trail Making Test   Part A (Time=41.87", T=44, 27th %)  Part B (Time=105.23", T=44, 27th %")    The patient was also administered the Trail Making Test part a and B to further assess this component of processing speed as well as cognitive flexibility and shifting ability.  [name] performances were also in the lower end to the low average range.  This pattern does suggest that overall he is showing some mild difficulties with regard to visual information processing speed but he is able to shift attention and concentration  effectively.   Index Score Summary  Index Sum of Scaled Scores Index Score Percentile Rank 95% Confidence Interval Qualitative Descriptor  Auditory Memory (AMI) 13 58 0.3 54-66 Extremely Low  Visual Memory (VMI) 11 76 5 72-82 Borderline  Immediate Memory (IMI) 13 65 1 61-73 Extremely Low  Delayed Memory (DMI) 11 61 0.5 56-72 Extremely Low    The patient was then administered the Wechsler memory scale-IV for older adults.  The patient showed significant impairments on almost all memory task assessed.  The patient showed the greatest memory deficits with regard to auditory memory which was significantly below his mild impairments on visual memory task.  However, both auditory and visual memory were impaired relative to both predicted levels based on historical information as well as normative comparisons.  When looking at immediate versus delayed memory the patient showed impairments for both delayed and immediate memory.  Given the fact that his encoding abilities were within normal limits this pattern does not suggest that it is simply a deficit with regard to attention/concentration or auditory or visual encoding impairments but is indicative of impairments with regard to organization/storage and retrieval of previously learned or presented information both auditorily and visually.   ABILITY-MEMORY ANALYSIS  Ability Score:    GAI: 94 Date of Testing:           WAIS-IV 2019/01/30; WMS-IV 2019/02/06           Predicted Difference Method   Index Predicted WMS-IV Index Score Actual WMS-IV Index Score Difference Critical Value  Significant Difference Y/N Base Rate  Auditory Memory 97 58 39 9.22 Y <1%  Visual Memory 96 76 20 8.25 Y 5-10%  Immediate Memory 96 65 31 10.13 Y <1%  Delayed Memory 97 61 36 11.43 Y <1%  Statistical significance (critical value) at the .01 level.    To further analyze the patient's memory functions we utilize the abilities-memory analysis protocols where  a predicted score is produced based on his general abilities index score from the Wechsler Adult Intelligence Scale.  This predicted score is then compared to his actual achieve score.  As stated above the patient showed significant profound memory deficits relative to predictions.  This was true for both predictions based on his normative expectations, predictions based on his historical variables related to education and occupational history as well as predictions based on his current achievement on his general abilities index score.  The patient performed between 20 and 39 points below predicted levels on various memory indices.  The most profound impairment had to do with auditory memory but also had  significant impairments relative to predicted scores for visual memory, immediate memory and delayed memory indices.  Executive functioning:  The patient was also given the modified [place] card sorting test which is a measure of reasoning and problem-solving as well as cognitive flexibility and adaptation.  The patient showed significant deficits with regard to visual reasoning and problem-solving, visual flexibility and executive functioning indices.  This is in marked contrast to his measures of verbal reasoning and problem-solving and estimates of visual spatial abilities and visual estimation and judgment measures.  Given the fact that he had done so well on some measures that do not require cognitive shifting the deficits found here are most likely due to rigidity of thinking and difficulty shifting and perseverating over these difficulties.  Perseverative errors are the biggest issue creating these cognitive impairments.   M-WCST  Number of Categories Correct (Raw=1, ss=3, T=19, <1st %)  Number of Perseverative Errors (Raw=14, ss=4, T=33, 4th %)  Number of Total Errors (Raw=28, ss=4, T=33, 4th %)  Percent of Perseverative Errors (Raw=50, ss=7, T=44, 27th %)  Executive Function Composite  (SS=60, <1st %)  Summary of Results:   The results of the current neuropsychological evaluation are consistent with very specific areas of cognitive and neuropsychological deficits.  Overall, the patient's global cognitive performances appear to be generally intact and consistent with predicted levels.  The patient shows specific deficits with regard to information processing speed and visual scanning visual searching abilities, cognitive flexibility and shifting abilities, and significant/severe deficits with regard to auditory and visual learning and memory both immediate and delayed.  [name] memory deficits cannot be explained by simple deficits with regard to attention and concentration as he did well on both auditory and visual encoding measures.  Impression/Diagnosis:   Overall, the results of this comprehensive objective neuropsychological evaluation are consistent with specific and significant cognitive deficits for both cognitive flexibility and shifting abilities (executive functioning), information processing speed and overall speed of mental operations, as well as deficits for learning and memory for both auditory and visual information.  [name] attention and concentration/encoding abilities appear well preserved and the patient is expressive and receptive language abilities appear well preserved.  The patient did well on a number of measures including visual spatial and visual processing abilities, verbal reasoning and problem-solving, and visual estimation and judgment abilities.  This pattern is not consistent with those typically seen with Alzheimer's type dementia but are consistent with some other  cortical/subcortical dementias.  [name] MRI does not suggest any significant cerebrovascular disease that would be sufficient to to explain the level of memory and executive functioning deficits.  The cognitive deficits noted are primarily controlled and mediated by  frontal/temporal brain regions with little signs or indications of parietal lobe involvement.  While the deficits are not primarily related to subcortical functioning it is possible that frontal subcortical involvement is present.  There are no significant reports of personality change or expressive language deficits so therefore even a diagnosis of frontotemporal dementia would be quite preliminary and just 1 of consideration.  However, there are clear cognitive deficits going on that go beyond simple age-related changes and perception of deficits.   At this point my we are still fairly early in the diagnostic strategy.  While I do not think his pattern is consistent with a classic Alzheimer's type dementia but may be related to similar types of patterns such as frontotemporal dementia I do think that it will be important to reassess in approximately 9  months so we can make direct comparisons to the patient's own performances and baseline testing that we have now that will allow [place] to go beyond relying on comparisons to normative comparison groups.  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 Neuropsychologist

## 2019-03-13 ENCOUNTER — Encounter: Payer: Medicare HMO | Attending: Psychology | Admitting: Psychology

## 2019-03-13 ENCOUNTER — Other Ambulatory Visit: Payer: Self-pay

## 2019-03-13 ENCOUNTER — Encounter: Payer: Self-pay | Admitting: Psychology

## 2019-03-13 DIAGNOSIS — G319 Degenerative disease of nervous system, unspecified: Secondary | ICD-10-CM | POA: Insufficient documentation

## 2019-03-13 DIAGNOSIS — G3109 Other frontotemporal dementia: Secondary | ICD-10-CM

## 2019-03-13 DIAGNOSIS — Z8249 Family history of ischemic heart disease and other diseases of the circulatory system: Secondary | ICD-10-CM | POA: Insufficient documentation

## 2019-03-13 DIAGNOSIS — I10 Essential (primary) hypertension: Secondary | ICD-10-CM | POA: Insufficient documentation

## 2019-03-13 DIAGNOSIS — R413 Other amnesia: Secondary | ICD-10-CM | POA: Insufficient documentation

## 2019-03-13 DIAGNOSIS — G9340 Encephalopathy, unspecified: Secondary | ICD-10-CM | POA: Insufficient documentation

## 2019-03-13 DIAGNOSIS — Z803 Family history of malignant neoplasm of breast: Secondary | ICD-10-CM | POA: Insufficient documentation

## 2019-03-13 DIAGNOSIS — R4189 Other symptoms and signs involving cognitive functions and awareness: Secondary | ICD-10-CM | POA: Insufficient documentation

## 2019-03-13 DIAGNOSIS — Z820 Family history of epilepsy and other diseases of the nervous system: Secondary | ICD-10-CM | POA: Insufficient documentation

## 2019-03-13 DIAGNOSIS — R7303 Prediabetes: Secondary | ICD-10-CM | POA: Insufficient documentation

## 2019-03-13 DIAGNOSIS — F0280 Dementia in other diseases classified elsewhere without behavioral disturbance: Secondary | ICD-10-CM

## 2019-03-13 DIAGNOSIS — Z833 Family history of diabetes mellitus: Secondary | ICD-10-CM | POA: Insufficient documentation

## 2019-03-13 NOTE — Progress Notes (Signed)
Today to provide feedback regarding the results of the recent neuropsychological evaluation.  This was a 1 hour visit that was conducted in person in my outpatient clinic office with myself, the patient and his wife present.  The patient appeared to understand the feedback and was able to understand some of the recommendations going forward.  It will be imperative that the patient take care of basic health issues such as nutrition, sleep pattern, and physical activity and I recommended him going forward with these issues.  The diagnosis of frontotemporal dementia is fairly preliminary at this point and we will need serial testing to confirm her rule out this particular pattern.  There are some clear deficits but these patterns are not consistent with situations like Alzheimer's, Lewy body or other potential degenerative dementias.  The areas of changes appear to be mediated almost exclusively bifrontal and temporal and possibly some subcortical components.  Parietal lobe functions appear to be quite well preserved.  Below you will find a copy of the summary and impressions of that formal evaluation.  The full evaluation can be found in his EMR dated 02/10/2019 with the initial clinical work-up and interview/history being found on his visit dated 01/29/2019.    Summary of Results:                        The results of the current neuropsychological evaluation are consistent with very specific areas of cognitive and neuropsychological deficits.  Overall, the patient's global cognitive performances appear to be generally intact and consistent with predicted levels.  The patient shows specific deficits with regard to information processing speed and visual scanning visual searching abilities, cognitive flexibility and shifting abilities, and significant/severe deficits with regard to auditory and visual learning and memory both immediate and delayed.  [name] memory deficits cannot be explained by simple  deficits with regard to attention and concentration as he did well on both auditory and visual encoding measures.  Impression/Diagnosis:                     Overall, the results of this comprehensive objective neuropsychological evaluation are consistent with specific and significant cognitive deficits for both cognitive flexibility and shifting abilities (executive functioning), information processing speed and overall speed of mental operations, as well as deficits for learning and memory for both auditory and visual information.  [name] attention and concentration/encoding abilities appear well preserved and the patient is expressive and receptive language abilities appear well preserved.  The patient did well on a number of measures including visual spatial and visual processing abilities, verbal reasoning and problem-solving, and visual estimation and judgment abilities.  This pattern is not consistent with those typically seen with Alzheimer's type dementia but are consistent with some other  cortical/subcortical dementias.  [name] MRI does not suggest any significant cerebrovascular disease that would be sufficient to to explain the level of memory and executive functioning deficits.  The cognitive deficits noted are primarily controlled and mediated by frontal/temporal brain regions with little signs or indications of parietal lobe involvement.  While the deficits are not primarily related to subcortical functioning it is possible that frontal subcortical involvement is present.  There are no significant reports of personality change or expressive language deficits so therefore even a diagnosis of frontotemporal dementia would be quite preliminary and just 1 of consideration.  However, there are clear cognitive deficits going on that go beyond simple age-related changes and perception of deficits.  At this point my we are still fairly early in the diagnostic strategy.  While I do  not think his pattern is consistent with a classic Alzheimer's type dementia but may be related to similar types of patterns such as frontotemporal dementia I do think that it will be important to reassess in approximately 9 months so we can make direct comparisons to the patient's own performances and baseline testing that we have now that will allow [place] to go beyond relying on comparisons to normative comparison groups.  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 Neuropsychologist

## 2019-03-26 ENCOUNTER — Ambulatory Visit: Payer: Medicare HMO | Admitting: Psychology

## 2019-04-21 ENCOUNTER — Ambulatory Visit: Payer: Medicare HMO | Admitting: Psychology

## 2019-05-14 ENCOUNTER — Telehealth: Payer: Self-pay | Admitting: *Deleted

## 2019-05-14 MED ORDER — METOPROLOL SUCCINATE ER 25 MG PO TB24: 25.0000 mg | ORAL_TABLET | Freq: Every day | ORAL | 0 refills | Status: DC

## 2019-05-14 NOTE — Telephone Encounter (Signed)
Rx sent into pharmacy  90 day no refills message to sent future refills to primary care provider  Last ov states follow up as needed

## 2019-12-10 ENCOUNTER — Ambulatory Visit: Payer: Medicare HMO | Admitting: Psychology

## 2019-12-11 ENCOUNTER — Ambulatory Visit: Payer: Medicare HMO | Admitting: Psychology

## 2019-12-24 ENCOUNTER — Encounter: Payer: Medicare HMO | Attending: Psychology | Admitting: Psychology

## 2019-12-24 ENCOUNTER — Other Ambulatory Visit: Payer: Self-pay

## 2019-12-24 DIAGNOSIS — G3109 Other frontotemporal dementia: Secondary | ICD-10-CM | POA: Diagnosis present

## 2019-12-24 DIAGNOSIS — F0280 Dementia in other diseases classified elsewhere without behavioral disturbance: Secondary | ICD-10-CM
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
## 2020-04-22 ENCOUNTER — Encounter: Payer: Medicare HMO | Attending: Psychology | Admitting: Psychology

## 2020-04-22 ENCOUNTER — Other Ambulatory Visit: Payer: Self-pay

## 2020-04-22 ENCOUNTER — Encounter: Payer: Self-pay | Admitting: Psychology

## 2020-04-22 DIAGNOSIS — F0390 Unspecified dementia without behavioral disturbance: Secondary | ICD-10-CM | POA: Diagnosis present

## 2020-04-22 DIAGNOSIS — F0280 Dementia in other diseases classified elsewhere without behavioral disturbance: Secondary | ICD-10-CM | POA: Diagnosis not present

## 2020-04-22 DIAGNOSIS — G3109 Other frontotemporal dementia: Secondary | ICD-10-CM | POA: Diagnosis present

## 2020-04-22 NOTE — Progress Notes (Signed)
   Neuropsychology Note  [name] completed 240 minutes of repeat neuropsychological testing with this provider. The patient did not appear overtly distressed by the testing session, per behavioral observation or via self-report. Rest breaks were offered.   Tests Administered:   Animal Naming   [place] (BNT)  Controlled Oral Word Association Test (COWAT)  Trail Making Test (Part A & B)  Wechsler Adult Intelligence Scale, 4th Edition (WAIS-IV)  Wechsler Memory Scale, 4th Edition (WMS-IV; Older Adult Battery)   Results: To be included once scoring is complete.   Andrew Lester will return on 05/05/20 for an interactive feedback session with Dr. Sima Matas at which time his test performances, clinical impressions and treatment recommendations will be reviewed in detail. The patient understands he can contact our office should he require our assistance before this time.  Full report to follow.

## 2020-04-28 ENCOUNTER — Other Ambulatory Visit: Payer: Self-pay

## 2020-04-28 ENCOUNTER — Encounter (HOSPITAL_BASED_OUTPATIENT_CLINIC_OR_DEPARTMENT_OTHER): Payer: Medicare HMO | Admitting: Psychology

## 2020-04-28 DIAGNOSIS — F0390 Unspecified dementia without behavioral disturbance: Secondary | ICD-10-CM

## 2020-04-28 DIAGNOSIS — G3109 Other frontotemporal dementia: Secondary | ICD-10-CM | POA: Diagnosis not present

## 2020-05-04 ENCOUNTER — Encounter: Payer: Self-pay | Admitting: Psychology

## 2020-05-04 NOTE — Progress Notes (Signed)
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 foods that are high in fiber, such as legumes (e.g., beans, peas, lentils) and foods made from whole grains (e.g., whole wheat bread and pasta)  . Consume a significant amount of omega-3 essential fats and oils.  These can be found in natural food sources such as salmon and other fatty fish, and also products made from flax seed and flax seed oil.  Alternately, dietary supplementation with fish oil capsules and flax seed oil capsules is a good way to boost one's level of omega-3 consumption.  It is important to check with your doctor before taking these supplements, especially if you take blood thinning medication.  . If you do not already do so, consider taking a quality multivitamin supplement under the guidance of your primary care physician.   . Consider keeping consumption of the following foods to a minimum: 1) foods made from white flour and white sugar; 2) artificial sweeteners; 3) deep-fried foods;  4) animal fat other than fish;  5) any foods containing "hydrogenated" or "trans" fats; 6) most other types of highly processed packaged/prepared foods.   9. The following are several strategies that may help:  . Performance will generally be best in a structured, routine, and familiar environment, as opposed to situations involving complex problems.  Marlene Lard a place to keep your keys, wallet, cell phone, and other personal belongings. . Take time to register and process information to be remembered. Deeper encoding of information can be gained by forming a mental picture, making meaningful associations, connecting new information to previously learned and related information, paraphrasing and repetition.  . To the extent possible, multitasking should be avoided; break down tasks into smaller steps to help get started and to keep from feeling overwhelmed. And if there are difficulties in organization and planning, maintaining a daily organizer to help keep track of important appointments and information may be beneficial.   . Memory problems may at least be minimally addressed using compensatory strategies such as the use of a daily schedule to follow, memos, portable recorder, a centrally located bulletin board, or memory notebook. A large calendar, placed in a highly visible location would be valuable to keep track of dates and appointments.  In addition, it would be helpful to keep a log of all of medical appointments with the name of the doctor, date of visit, diagnoses, and treatments.  . Use of a medication box is recommended to ensure compliance and decrease confusion regarding medication dosages, times, and dates. . To aid in managing problems with attention, the patient may consider using some of the following strategies: o The patient should simplify tasks.  There may be a need to break overly complex activities into simple step-by-step tasks, keep these steps written down in a note book and then check them off as they are  completed which will help to stay on task and make sure the whole task is finished.  o The patient should set deadlines for everything, even for seemingly small tasks, prioritize time-sensitive tasks and write down every assignment, message, or important thought. o The patient is encouraged to use timers and alarms to stay on track and take breaks at regular intervals. Avoid piles of paperwork or procrastination by dealing with each item as it comes in.  10. Neuropsychological re-assessment in one year is recommended in order to monitor cognitive status, track progression of symptoms and further assist with treatment planning.    Feedback to Patient: Andrew Lester will return for a feedback appointment on 05/05/20 to review the results of his neuropsychological evaluation with Dr. Sima Matas .   Thank you for your referral of Andrew Lester. Please feel free to contact me if you have any questions or concerns regarding this report.

## 2020-05-05 ENCOUNTER — Encounter: Payer: Medicare HMO | Admitting: Psychology

## 2020-05-05 ENCOUNTER — Other Ambulatory Visit: Payer: Self-pay

## 2020-05-05 DIAGNOSIS — F0390 Unspecified dementia without behavioral disturbance: Secondary | ICD-10-CM

## 2020-05-05 DIAGNOSIS — G3109 Other frontotemporal dementia: Secondary | ICD-10-CM | POA: Diagnosis not present

## 2020-05-12 NOTE — Progress Notes (Signed)
Today I provided feedback regarding the results of the current evaluation and direct comparisons to previous testing conducted in 2020.  There were some relative improvements in her stability and some cognitive domains in some areas showing mild decline from previous testing.  This does not appear to be consistent with a progressive dementia-like process but he does have ongoing deficits needing criterion of a major neurocognitive disorder.  We cannot completely rule out the possibility of an underlying neurodegenerative disease but current stability and testing in some domains would suggest limiting the likelihood of this process.  [name] complete neuropsychological evaluation can be found in the patient's EMR dated 04/28/2020.  I have included the clinical impressions and recommendations put forth by Dr. Darol Lester in that formal interpretation of testing results.   Clinical Impressions:  Overall, the results of the current neuropsychological evaluation do show some notable decline in certain areas of learning and memory compared to previous testing (01/2019) with some relative improvement and/or stability in other cognitive domains. This likely reflects several factors working in combination.His memory dysfunction and deficits in executive functioning, with some reported decline in activities of daily living, warrant a diagnosis of Major Neurocognitive Disorder at this time.Andrew Lester demonstrates difficulties predominantlyin memory functioning, semantic fluency, information processing speed, and aspects of executive functioning. [name] attention and concentration/encoding abilities appear variable but preserved overall. Expressive and receptive language abilities appear mostly intact with the exception of semantic fluency. The patient continued to perform well on measures of visuospatial and perceptual reasoning, problem solving, and spatial judgment.   His condition may reflect  contributions from vascular involvement (e.g., frontal subcortical) particularly but this is less likely to fully account for these deficits given minimal amount of generalized cortical atrophy and chronic microvascular ischemic change observed on recent imaging. Thus, we cannot rule-out the possibility of an underlying neurodegenerative disease process such as a Alzheimer's Disease (with cerebrovascular involvement). Functional neuroimaging (i.e., FDG PET/ SPECT) would help to clarify the current clinical picture. Indeed, the pattern of cognitive deficits (i.e., impaired categorical langauge fluncy, retrieval difficulties, and executive difficulties), in concert with the gradual and progressive nature of his symptoms, may possibly be indicative of a neurodegenerative process that is at the sub-clinical stage. The patient and family members are encouraged to closely monitor the patient's cognitive and independent functioning over time. His cognitive functioning should also be monitored through serial neuropsychological assessment.  Fortunately, there is no evidence of clinical depression or anxiety. There are also no reports of behavioral or bizarre perceptual/sensory disturbance.    Recommendations: Based on the findings of the present evaluation, the following recommendations are offered:  1. Consider supplementation with memantine. Of course, there is a cost-benefit analysis that must be considered carefully.  Whether this is initiated is best left determined by the patient's care providers.    2. The patient should continue to receive assistance with complex ADLs, including finances, cooking and transportation. His wife should monitor his medications to make sure they are being taken correctly. As long as a spouse or family member that can stay with the patient remains healthy, no changes are needed in living situation.   3. The patient should continue to limit or refrain from driving, as deficits  noted on testing could affect one's ability to safely operate a motor vehicle.  At minimum, it is recommended that this person undergo a formal driving evaluation. Could contact Andrew Lester at: [phone].    4. It is recommended that the severity  of the patient's impairments is considered when assessing the level of asset management required. For example, given impairment higher-level reasoning and problem-solving skills, it is recommended that the patient consult with a family member or another trusted advisor prior to making any important medical, legal, or financial decisions. Establishing or continuing to rely upon someone who has Power of Musician and medical decision-making is recommended.    5. Regular medical care is important for an individual with dementia. Therefore, make sure to maintain regular appointments with all medical providers. In addition, schedule these appointments during the patient's best time of day.  6. Education regarding Alzheimer's disease will be provided to the patient and his wife. [name] family may wish to attend a local dementia caregiver support group and/or seek additional information from the Alzheimer's Association ([employer]). They may wish to seek additional resources through Andrew Lester.  7. The patient should continue to participate in activities which provide mental stimulation, safe cardiovascular exercise, and social interaction.  8. Nutritional factors can have a significant effect on psychological and emotional status, as well as overall brain functioning.  The following general recommendations have been associated with improvements in depression and other psychological symptoms, as well as lower risk for dementia and other forms of cognitive impairment.  Please discuss these recommendations with your physician and/or dietitian before initiating:   Consume a wide variety of  fresh fruits and vegetables, particularly including brightly colored items such as berries, oranges, tomatoes, peppers, carrots, broccoli, spinach, dark green lettuces, sweet potatoes, etc., all of which are high in vitamins and antioxidants.   Consume foods that are high in fiber, such as legumes (e.g., beans, peas, lentils) and foods made from whole grains (e.g., whole wheat bread and pasta)   Consume a significant amount of omega-3 essential fats and oils.  These can be found in natural food sources such as salmon and other fatty fish, and also products made from flax seed and flax seed oil.  Alternately, dietary supplementation with fish oil capsules and flax seed oil capsules is a good way to boost one's level of omega-3 consumption.  It is important to check with your doctor before taking these supplements, especially if you take blood thinning medication.   If you do not already do so, consider taking a quality multivitamin supplement under the guidance of your primary care physician.    Consider keeping consumption of the following foods to a minimum: 1) foods made from white flour and white sugar; 2) artificial sweeteners; 3) deep-fried foods; 4) animal fat other than fish; 5) any foods containing "hydrogenated" or "trans" fats; 6) most other types of highly processed packaged/prepared foods.   9. The following are several strategies that may help:   Performance will generally be best in a structured, routine, and familiar environment, as opposed to situations involving complex problems.   Designate a place to keep your keys, wallet, cell phone, and other personal belongings.  Take time to register and process information to be remembered. Deeper encoding of information can be gained by forming a mental picture, making meaningful associations, connecting new information to previously learned and related information, paraphrasing and repetition.   To the extent possible,  multitasking should be avoided; break down tasks into smaller steps to help get started and to keep from feeling overwhelmed. And if there are difficulties in organization and planning, maintaining a daily organizer to help keep track of important appointments and information may be beneficial.  Memory problems may at least be minimally addressed using compensatory strategies such as the use of a daily schedule to follow, memos, portable recorder, a centrally located bulletin board, or memory notebook. A large calendar, placed in a highly visible location would be valuable to keep track of dates and appointments.  In addition, it would be helpful to keep a log of all of medical appointments with the name of the doctor, date of visit, diagnoses, and treatments.   Use of a medication box is recommended to ensure compliance and decrease confusion regarding medication dosages, times, and dates.  To aid in managing problems with attention, the patient may consider using some of the following strategies: ? The patient should simplify tasks.  There may be a need to break overly complex activities into simple step-by-step tasks, keep these steps written down in a note book and then check them off as they are completed which will help to stay on task and make sure the whole task is finished.  ? The patient should set deadlines for everything, even for seemingly small tasks, prioritize time-sensitive tasks and write down every assignment, message, or important thought. ? The patient is encouraged to use timers and alarms to stay on track and take breaks at regular intervals. Avoid piles of paperwork or procrastination by dealing with each item as it comes in.  10. Neuropsychological re-assessment in one year is recommended in order to monitor cognitive status, track progression of symptoms and further assist with treatment planning.

## 2021-01-26 NOTE — Progress Notes (Signed)
 Spoke with the Patient today to discuss appointments and screenings due for the upcoming year.     Declined verifying DOB

## 2022-08-22 ENCOUNTER — Emergency Department (HOSPITAL_COMMUNITY)
Admission: EM | Admit: 2022-08-22 | Discharge: 2022-08-22 | Disposition: A | Discharge status: Home / Self Care | Payer: Medicare HMO | Attending: Emergency Medicine | Admitting: Emergency Medicine

## 2022-08-22 ENCOUNTER — Encounter (HOSPITAL_COMMUNITY): Payer: Self-pay

## 2022-08-22 ENCOUNTER — Emergency Department (HOSPITAL_COMMUNITY): Payer: Medicare HMO

## 2022-08-22 DIAGNOSIS — R1011 Right upper quadrant pain: Secondary | ICD-10-CM | POA: Diagnosis present

## 2022-08-22 DIAGNOSIS — N201 Calculus of ureter: Secondary | ICD-10-CM

## 2022-08-22 DIAGNOSIS — I10 Essential (primary) hypertension: Secondary | ICD-10-CM | POA: Diagnosis not present

## 2022-08-22 DIAGNOSIS — Z79899 Other long term (current) drug therapy: Secondary | ICD-10-CM | POA: Diagnosis not present

## 2022-08-22 DIAGNOSIS — K8050 Calculus of bile duct without cholangitis or cholecystitis without obstruction: Secondary | ICD-10-CM | POA: Diagnosis not present

## 2022-08-22 DIAGNOSIS — K8020 Calculus of gallbladder without cholecystitis without obstruction: Secondary | ICD-10-CM

## 2022-08-22 LAB — CBC WITH DIFFERENTIAL/PLATELET
Abs Immature Granulocytes: 0.02 10*3/uL (ref 0.00–0.07)
Basophils Absolute: 0 10*3/uL (ref 0.0–0.1)
Basophils Relative: 0 %
Eosinophils Absolute: 0.1 10*3/uL (ref 0.0–0.5)
Eosinophils Relative: 1 %
HCT: 45.2 % (ref 39.0–52.0)
Hemoglobin: 15.5 g/dL (ref 13.0–17.0)
Immature Granulocytes: 0 %
Lymphocytes Relative: 10 %
Lymphs Abs: 0.9 10*3/uL (ref 0.7–4.0)
MCH: 31.5 pg (ref 26.0–34.0)
MCHC: 34.3 g/dL (ref 30.0–36.0)
MCV: 91.9 fL (ref 80.0–100.0)
Monocytes Absolute: 0.6 10*3/uL (ref 0.1–1.0)
Monocytes Relative: 6 %
Neutro Abs: 8 10*3/uL — ABNORMAL HIGH (ref 1.7–7.7)
Neutrophils Relative %: 83 %
Platelets: 189 10*3/uL (ref 150–400)
RBC: 4.92 MIL/uL (ref 4.22–5.81)
RDW: 13.1 % (ref 11.5–15.5)
WBC: 9.7 10*3/uL (ref 4.0–10.5)
nRBC: 0 % (ref 0.0–0.2)

## 2022-08-22 LAB — URINALYSIS, W/ REFLEX TO CULTURE (INFECTION SUSPECTED)
Bacteria, UA: NONE SEEN
Bilirubin Urine: NEGATIVE
Glucose, UA: NEGATIVE mg/dL
Ketones, ur: NEGATIVE mg/dL
Leukocytes,Ua: NEGATIVE
Nitrite: NEGATIVE
Protein, ur: NEGATIVE mg/dL
Specific Gravity, Urine: 1.004 — ABNORMAL LOW (ref 1.005–1.030)
pH: 7 (ref 5.0–8.0)

## 2022-08-22 LAB — BASIC METABOLIC PANEL
Anion gap: 8 (ref 5–15)
BUN: 16 mg/dL (ref 8–23)
CO2: 29 mmol/L (ref 22–32)
Calcium: 9.3 mg/dL (ref 8.9–10.3)
Chloride: 103 mmol/L (ref 98–111)
Creatinine, Ser: 0.97 mg/dL (ref 0.61–1.24)
GFR, Estimated: >60 mL/min (ref >60)
Glucose, Bld: 123 mg/dL — ABNORMAL HIGH (ref 70–99)
Potassium: 4 mmol/L (ref 3.5–5.1)
Sodium: 140 mmol/L (ref 135–145)

## 2022-08-22 LAB — HEPATIC FUNCTION PANEL
ALT: 20 U/L (ref 0–44)
AST: 18 U/L (ref 15–41)
Albumin: 4.4 g/dL (ref 3.5–5.0)
Alkaline Phosphatase: 39 U/L (ref 38–126)
Bilirubin, Direct: 0.2 mg/dL (ref 0.0–0.2)
Indirect Bilirubin: 1 mg/dL — ABNORMAL HIGH (ref 0.3–0.9)
Total Bilirubin: 1.2 mg/dL (ref 0.3–1.2)
Total Protein: 7.2 g/dL (ref 6.5–8.1)

## 2022-08-22 MED ORDER — LACTATED RINGERS IV BOLUS: 500.0000 mL | Freq: Once | INTRAVENOUS | Status: DC

## 2022-08-22 MED ORDER — IOHEXOL 350 MG/ML SOLN
100.0000 mL | Freq: Once | INTRAVENOUS | Status: AC | PRN
  Administered 2022-08-22: 100 mL via INTRAVENOUS

## 2022-08-22 NOTE — ED Triage Notes (Signed)
Pt presents with c/o right side rib cage pain. Family says pt is unable to remember whether he fell or not. Pt does have some baseline confusion per family.

## 2022-08-22 NOTE — ED Provider Notes (Signed)
72 yo male presenting to the ED with distal right kidney stone, pending UA   Physical Exam  BP 136/81   Pulse 89   Temp 98.1 F (36.7 C) (Oral)   Resp 17   SpO2 99%   Physical Exam  Procedures  Procedures  ED Course / MDM    Medical Decision Making Amount and/or Complexity of Data Reviewed Labs: ordered. Radiology: ordered.  Risk Prescription drug management.   Patient reassessed and remains asymptomatic.  We discussed his gallstones is likely cause of his pain per his description, this is likely biliary colic.  We discussed gallbladder eating plan and avoiding greasy and fatty foods.  His wife is present for the history.  I also told him about the incidental distal ureteral stone, for which she has no active symptoms at this time.     Wyvonnia Dusky, MD 08/22/22 [phone]

## 2022-08-22 NOTE — ED Provider Notes (Signed)
[facility] AT [facility] Provider Note   CSN: [id number] Arrival date & time: 08/22/22  1005     History  Chief Complaint  Patient presents with   Rib Injury    Andrew Lester is a 72 y.o. male.  HPI   72 year old male with medical history significant for hypertension, nephrolithiasis who presents emergency department with right-sided rib pain.  The patient states that he has had right upper quadrant abdominal/rib pain that radiates to his back that is described as sharp with some associated mild nausea.  Pain started over the last day.  He denies any left-sided chest pressure.  No shortness of breath.  No vomiting.  He denies any abdominal pain.  Home Medications Prior to Admission medications   Medication Sig Start Date End Date Taking? Authorizing Provider  amLODipine (NORVASC) 5 MG tablet Take 5 mg by mouth daily as needed.    [provider]  donepezil (ARICEPT) 5 MG tablet Take 1 tablet (5 mg total) by mouth at bedtime. 07/09/18   Anson Fret, MD  fluticasone (FLONASE) 50 MCG/ACT nasal spray Place 2 sprays into both nostrils daily. Patient taking differently: Place 2 sprays into both nostrils daily as needed.  01/08/18   Curatolo, Adam, DO  metoprolol succinate (TOPROL XL) 25 MG 24 hr tablet Take 1 tablet (25 mg total) by mouth daily. 05/14/19   Nahser, Deloris Ping, MD  OVER THE COUNTER MEDICATION Take 1 lozenge by mouth daily as needed (nasal congestion). Eucalyptus lozenge    [provider]  sodium chloride (OCEAN) 0.65 % SOLN nasal spray Place 1 spray into both nostrils as needed for congestion. Patient not taking: Reported on 07/09/2018 01/08/18   Virgina Norfolk, DO      Allergies    Amoxicillin and Erythromycin    Review of Systems   Review of Systems  All other systems reviewed and are negative.   Physical Exam Updated Vital Signs BP (!) 155/107   Pulse 85   Temp 98 F (36.7 C) (Oral)   Resp 17   SpO2 98%   Physical Exam Vitals and nursing note reviewed.  Constitutional:      General: He is not in acute distress.    Appearance: He is well-developed.  HENT:     Head: Normocephalic and atraumatic.  Eyes:     Conjunctiva/sclera: Conjunctivae normal.  Cardiovascular:     Rate and Rhythm: Normal rate and regular rhythm.     Heart sounds: No murmur heard. Pulmonary:     Effort: Pulmonary effort is normal. No respiratory distress.     Breath sounds: Normal breath sounds.  Abdominal:     Palpations: Abdomen is soft.     Tenderness: There is abdominal tenderness in the right upper quadrant. There is no right CVA tenderness, left CVA tenderness, guarding or rebound.  Musculoskeletal:        General: No swelling.     Cervical back: Neck supple.  Skin:    General: Skin is warm and dry.     Capillary Refill: Capillary refill takes less than 2 seconds.  Neurological:     Mental Status: He is alert.  Psychiatric:        Mood and Affect: Mood normal.     ED Results / Procedures / Treatments   Labs (all labs ordered are listed, but only abnormal results are displayed) Labs Reviewed  CBC WITH DIFFERENTIAL/PLATELET - Abnormal; Notable for the following components:  Result Value   Neutro Abs 8.0 (*)    All other components within normal limits  BASIC METABOLIC PANEL - Abnormal; Notable for the following components:   Glucose, Bld 123 (*)    All other components within normal limits  HEPATIC FUNCTION PANEL - Abnormal; Notable for the following components:   Indirect Bilirubin 1.0 (*)    All other components within normal limits  URINALYSIS, W/ REFLEX TO CULTURE (INFECTION SUSPECTED) - Abnormal; Notable for the following components:   Color, Urine STRAW (*)    Specific Gravity, Urine 1.004 (*)    Hgb urine dipstick MODERATE (*)    All other components within normal limits    EKG EKG Interpretation  Date/Time:  Wednesday August 22 2022 11:22:18 EDT Ventricular Rate:  89 PR  Interval:  147 QRS Duration: 84 QT Interval:  366 QTC Calculation: 446 R Axis:   42 Text Interpretation: Sinus rhythm Confirmed by Ernie AvenaLawsing, Zoeann Mol ([id number]) on 08/22/2022 12:03:41 PM  Radiology No results found.  Procedures Procedures    Medications Ordered in ED Medications  iohexol (OMNIPAQUE) 350 MG/ML injection 100 mL (100 mLs Intravenous Contrast Given 08/22/22 1511)    ED Course/ Medical Decision Making/ A&P                             Medical Decision Making Amount and/or Complexity of Data Reviewed Labs: ordered. Radiology: ordered.  Risk Prescription drug management.     72 year old male with medical history significant for hypertension, nephrolithiasis who presents emergency department with right-sided rib pain.  The patient states that he has had right upper quadrant abdominal/rib pain that radiates to his back that is described as sharp with some associated mild nausea.  Pain started over the last day.  He denies any left-sided chest pressure.  No shortness of breath.  No vomiting.  He denies any abdominal pain.  On arrival, the patient was vitally stable, afebrile, not tachycardic or tachypneic, hypertensive BP 164/94, saturating 90% on room air.  Physical exam significant for right upper quadrant tenderness to palpation, no rebound or guarding.  Considered cholelithiasis/cholecystitis, pancreatitis, peptic ulcer disease, also considered PE given the location.  Initial EKG revealed sinus rhythm, ventricular rate 89, no acute ischemic changes, no abnormal intervals.  A chest x-ray/rib x-ray was performed which was unremarkable.  He denies any recent falls or trauma.  No tenderness to palpation on exam.  CT angiogram revealed gallstones, no evidence of PE.  Incidentally did reveal an obstructing left ureteral stone.  The patient on reassessment did say he had had some sharp pain in his left flank last night but that is since resolved.   IMPRESSION:  1. No pulmonary embolism  or acute process in the chest.  2. Cholelithiasis without acute cholecystitis  3. Partially obstructive distal left ureteric 4 x 5 mm stone.  4. Bilateral nephrolithiasis  5.  Aortic Atherosclerosis (ICD10-I70.0).    Urinalysis pending to evaluate for source of infection.  Laboratory evaluation generally unremarkable with no evidence of AKI.  CBC without a leukocytosis or anemia, hepatic function panel generally unremarkable, mildly elevated indirect bili but normal T. bili, normal LFTs, BMP without evidence of electrolyte abnormality.  Patient pending urinalysis at time of signout. Signout given to Dr. Renaye Rakersrifan at [phone].  Plan for likely DC and outpatient follow-up if reassuring.   Final Clinical Impression(s) / ED Diagnoses Final diagnoses:  Ureteral stone  Calculus of gallbladder without cholecystitis without obstruction  Biliary colic    Rx / DC Orders ED Discharge Orders     None         Ernie AvenaLawsing, Kyrsten Deleeuw, MD 08/27/22 1601

## 2022-08-22 NOTE — Discharge Instructions (Addendum)
Your CT scan showed that you have gallstones.  This is likely a cause of your pain.  You should follow-up with a general surgeon for this issue.  Please read over the gallstone eating plan and try to avoid greasy and fatty foods.  Your CT scan showed that you are passing a 4 mm kidney stone on your left side.  The stone is small enough that it should likely pass on its own over the next few days.  If you do have worsening pain, persistent vomiting, difficulty or inability to urinate, or other medical emergency concerns, he should return to the ER.

## 2022-09-12 ENCOUNTER — Other Ambulatory Visit: Payer: Self-pay | Admitting: General Surgery

## 2022-09-28 NOTE — Progress Notes (Signed)
Surgical Instructions    Your procedure is scheduled on May 20 ,2024.  Report to [facility] Main Entrance "A" at 5:30 A.M., then check in with the Admitting office.  Call this number if you have problems the morning of surgery:  [phone]   If you have any questions prior to your surgery date call [phone]: Open Monday-Friday 8am-4pm If you experience any cold or flu symptoms such as cough, fever, chills, shortness of breath, etc. between now and your scheduled surgery, please notify us at the above number     Remember:  Do not eat after midnight the night before your surgery  You may drink clear liquids until 4:30am the morning of your surgery.   Clear liquids allowed are: Water, Non-Citrus Juices (without pulp), Carbonated Beverages, Clear Tea, Black Coffee ONLY (NO MILK, CREAM OR POWDERED CREAMER of any kind), and Gatorade Patient Instructions  The night before surgery:  No food after midnight. ONLY clear liquids after midnight  The day of surgery (if you do NOT have diabetes):  Drink ONE (1) Pre-Surgery Clear Ensure by 4:30am the morning of surgery. Drink in one sitting. Do not sip.  This drink was given to you during your hospital  pre-op appointment visit.  Nothing else to drink after completing the  Pre-Surgery Clear Ensure        If you have questions, please contact your surgeon's office.     Take these medicines the morning of surgery with A SIP OF WATER:  Oxymetazoline HCl (SINEX LONG-ACTING NA) if needed  As of today, STOP taking any Aspirin (unless otherwise instructed by your surgeon) Aleve, Naproxen, Ibuprofen, Motrin, Advil, Goody's, BC's, all herbal medications, fish oil, and all vitamins.           Do not wear jewelry or makeup. Do not wear lotions, powders, perfumes/cologne or deodorant. Do not shave 48 hours prior to surgery.  Men may shave face and neck. Do not bring valuables to the hospital. Do not wear nail polish, gel polish, artificial nails,  or any other type of covering on natural nails (fingers and toes) If you have artificial nails or gel coating that need to be removed by a nail salon, please have this removed prior to surgery. Artificial nails or gel coating may interfere with anesthesia's ability to adequately monitor your vital signs.  [facility] is not responsible for any belongings or valuables.    Do NOT Smoke (Tobacco/Vaping)  24 hours prior to your procedure  If you use a CPAP at night, you may bring your mask for your overnight stay.   Contacts, glasses, hearing aids, dentures or partials may not be worn into surgery, please bring cases for these belongings   For patients admitted to the hospital, discharge time will be determined by your treatment team.   Patients discharged the day of surgery will not be allowed to drive home, and someone needs to stay with them for 24 hours.   SURGICAL WAITING ROOM VISITATION Patients having surgery or a procedure may have no more than 2 support people in the waiting area - these visitors may rotate.   Children under the age of 92 must have an adult with them who is not the patient. If the patient needs to stay at the hospital during part of their recovery, the visitor guidelines for inpatient rooms apply. Pre-op nurse will coordinate an appropriate time for 1 support person to accompany patient in pre-op.  This support person may not rotate.  Please refer to [email or web address] for the visitor guidelines for Inpatients (after your surgery is over and you are in a regular room).    Special instructions:    Oral Hygiene is also important to reduce your risk of infection.  Remember - BRUSH YOUR TEETH THE MORNING OF SURGERY WITH YOUR REGULAR TOOTHPASTE   [facility]- Preparing For Surgery  Before surgery, you can play an important role. Because skin is not sterile, your skin needs to be as free of germs as possible. You can  reduce the number of germs on your skin by washing with CHG (chlorahexidine gluconate) Soap before surgery.  CHG is an antiseptic cleaner which kills germs and bonds with the skin to continue killing germs even after washing.     Please do not use if you have an allergy to CHG or antibacterial soaps. If your skin becomes reddened/irritated stop using the CHG.  Do not shave (including legs and underarms) for at least 48 hours prior to first CHG shower. It is OK to shave your face.  Please follow these instructions carefully.     Shower the NIGHT BEFORE SURGERY and the MORNING OF SURGERY with CHG Soap.   If you chose to wash your hair, wash your hair first as usual with your normal shampoo. After you shampoo, rinse your hair and body thoroughly to remove the shampoo.  Then [employer] and genitals (private parts) with your normal soap and rinse thoroughly to remove soap.  After that Use CHG Soap as you would any other liquid soap. You can apply CHG directly to the skin and wash gently with a scrungie or a clean washcloth.   Apply the CHG Soap to your body ONLY FROM THE NECK DOWN.  Do not use on open wounds or open sores. Avoid contact with your eyes, ears, mouth and genitals (private parts). Wash Face and genitals (private parts)  with your normal soap.   Wash thoroughly, paying special attention to the area where your surgery will be performed.  Thoroughly rinse your body with warm water from the neck down.  DO NOT shower/wash with your normal soap after using and rinsing off the CHG Soap.  Pat yourself dry with a CLEAN TOWEL.  Wear CLEAN PAJAMAS to bed the night before surgery  Place CLEAN SHEETS on your bed the night before your surgery  DO NOT SLEEP WITH PETS.   Day of Surgery:  Take a shower with CHG soap. Wear Clean/Comfortable clothing the morning of surgery Do not apply any deodorants/lotions.   Remember to brush your teeth WITH YOUR REGULAR TOOTHPASTE.    If you received  a COVID test during your pre-op visit, it is requested that you wear a mask when out in public, stay away from anyone that may not be feeling well, and notify your surgeon if you develop symptoms. If you have been in contact with anyone that has tested positive in the last 10 days, please notify your surgeon.    Please read over the following fact sheets that you were given.

## 2022-10-01 ENCOUNTER — Other Ambulatory Visit: Payer: Self-pay

## 2022-10-01 ENCOUNTER — Encounter (HOSPITAL_COMMUNITY): Payer: Self-pay

## 2022-10-01 ENCOUNTER — Encounter (HOSPITAL_COMMUNITY)
Admission: RE | Admit: 2022-10-01 | Discharge: 2022-10-01 | Disposition: A | Discharge status: Home / Self Care | Payer: Medicare HMO | Source: Ambulatory Visit | Attending: General Surgery | Admitting: General Surgery

## 2022-10-01 VITALS — BP 158/93 | Temp 98.2°F | Resp 20 | Ht 64.0 in | Wt 148.1 lb

## 2022-10-01 DIAGNOSIS — Z01812 Encounter for preprocedural laboratory examination: Secondary | ICD-10-CM | POA: Insufficient documentation

## 2022-10-01 DIAGNOSIS — Z01818 Encounter for other preprocedural examination: Secondary | ICD-10-CM

## 2022-10-01 HISTORY — DX: Unspecified dementia, unspecified severity, without behavioral disturbance, psychotic disturbance, mood disturbance, and anxiety: F03.90

## 2022-10-01 HISTORY — DX: Personal history of urinary calculi: Z87.442

## 2022-10-01 LAB — BASIC METABOLIC PANEL
Anion gap: 9 (ref 5–15)
BUN: 10 mg/dL (ref 8–23)
CO2: 27 mmol/L (ref 22–32)
Calcium: 9.2 mg/dL (ref 8.9–10.3)
Chloride: 103 mmol/L (ref 98–111)
Creatinine, Ser: 0.94 mg/dL (ref 0.61–1.24)
GFR, Estimated: >60 mL/min (ref >60)
Glucose, Bld: 106 mg/dL — ABNORMAL HIGH (ref 70–99)
Potassium: 3.8 mmol/L (ref 3.5–5.1)
Sodium: 139 mmol/L (ref 135–145)

## 2022-10-01 LAB — CBC
HCT: 45.7 % (ref 39.0–52.0)
Hemoglobin: 16.1 g/dL (ref 13.0–17.0)
MCH: 32.3 pg (ref 26.0–34.0)
MCHC: 35.2 g/dL (ref 30.0–36.0)
MCV: 91.8 fL (ref 80.0–100.0)
Platelets: 243 10*3/uL (ref 150–400)
RBC: 4.98 MIL/uL (ref 4.22–5.81)
RDW: 13.4 % (ref 11.5–15.5)
WBC: 7 10*3/uL (ref 4.0–10.5)
nRBC: 0 % (ref 0.0–0.2)

## 2022-10-01 NOTE — Progress Notes (Signed)
PCP - Darlen Round Ross,M.D. Cardiologist - denies  PPM/ICD - denies Device Orders -  Rep Notified -  Chest x-ray - 01/08/18 EKG - 08/22/22 Stress Test - denies ECHO - 02/07/18 Cardiac Cath - denies  Sleep Study - denies CPAP -   Fasting Blood Sugar - na Checks Blood Sugar _____ times a day  Last dose of GLP1 agonist- na GLP1 instructions: na  Blood Thinner Instructions:na Aspirin Instructions:na  ERAS Protcol -clear liquids until 0430 PRE-SURGERY Ensure or G2- Ensure  COVID TEST- na   Anesthesia review: no  Patient denies shortness of breath, fever, cough and chest pain at PAT appointment   All instructions explained to the patient, with a verbal understanding of the material. Patient agrees to go over the instructions while at home for a better understanding. Patient also instructed to wear a mask when out in public prior to surgery. . The opportunity to ask questions was provided.

## 2022-10-07 NOTE — Anesthesia Preprocedure Evaluation (Signed)
Anesthesia Evaluation  Patient identified by MRN, date of birth, ID band Patient awake and Patient confused    Reviewed: Allergy & Precautions, NPO status , Patient's Chart, lab work & pertinent test results  History of Anesthesia Complications Negative for: history of anesthetic complications  Airway Mallampati: II  TM Distance: >3 FB Neck ROM: Full    Dental  (+) Missing,    Pulmonary neg pulmonary ROS   Pulmonary exam normal        Cardiovascular hypertension, Normal cardiovascular exam     Neuro/Psych       Dementia negative neurological ROS     GI/Hepatic Neg liver ROS,,,gallstones   Endo/Other  negative endocrine ROS    Renal/GU negative Renal ROS     Musculoskeletal negative musculoskeletal ROS (+)    Abdominal   Peds  Hematology negative hematology ROS (+)   Anesthesia Other Findings Day of surgery medications reviewed with patient.  Reproductive/Obstetrics                              Anesthesia Physical Anesthesia Plan  ASA: 2  Anesthesia Plan: General   Post-op Pain Management: Tylenol PO (pre-op)* and Regional block*   Induction: Intravenous  PONV Risk Score and Plan: 3 and Ondansetron, Dexamethasone and Treatment may vary due to age or medical condition  Airway Management Planned: Oral ETT  Additional Equipment: None  Intra-op Plan:   Post-operative Plan: Extubation in OR  Informed Consent: I have reviewed the patients History and Physical, chart, labs and discussed the procedure including the risks, benefits and alternatives for the proposed anesthesia with the patient or authorized representative who has indicated his/her understanding and acceptance.     Dental advisory given and Consent reviewed with POA  Plan Discussed with: CRNA  Anesthesia Plan Comments:         Anesthesia Quick Evaluation

## 2022-10-08 ENCOUNTER — Ambulatory Visit (HOSPITAL_BASED_OUTPATIENT_CLINIC_OR_DEPARTMENT_OTHER): Payer: Medicare HMO | Admitting: Anesthesiology

## 2022-10-08 ENCOUNTER — Ambulatory Visit (HOSPITAL_COMMUNITY)
Admission: RE | Admit: 2022-10-08 | Discharge: 2022-10-08 | Disposition: A | Discharge status: Home / Self Care | Payer: Medicare HMO | Attending: General Surgery | Admitting: General Surgery

## 2022-10-08 ENCOUNTER — Encounter (HOSPITAL_COMMUNITY)
Admission: RE | Disposition: A | Discharge status: Home / Self Care | Payer: Self-pay | Source: Home / Self Care | Attending: General Surgery

## 2022-10-08 ENCOUNTER — Other Ambulatory Visit: Payer: Self-pay

## 2022-10-08 ENCOUNTER — Ambulatory Visit (HOSPITAL_COMMUNITY): Payer: Medicare HMO | Admitting: Anesthesiology

## 2022-10-08 ENCOUNTER — Encounter (HOSPITAL_COMMUNITY): Payer: Self-pay | Admitting: General Surgery

## 2022-10-08 DIAGNOSIS — K8010 Calculus of gallbladder with chronic cholecystitis without obstruction: Secondary | ICD-10-CM | POA: Diagnosis present

## 2022-10-08 DIAGNOSIS — I10 Essential (primary) hypertension: Secondary | ICD-10-CM | POA: Insufficient documentation

## 2022-10-08 DIAGNOSIS — F0390 Unspecified dementia without behavioral disturbance: Secondary | ICD-10-CM | POA: Diagnosis not present

## 2022-10-08 DIAGNOSIS — K8020 Calculus of gallbladder without cholecystitis without obstruction: Secondary | ICD-10-CM | POA: Diagnosis not present

## 2022-10-08 DIAGNOSIS — N201 Calculus of ureter: Secondary | ICD-10-CM | POA: Insufficient documentation

## 2022-10-08 HISTORY — PX: CHOLECYSTECTOMY: SHX55

## 2022-10-08 SURGERY — LAPAROSCOPIC CHOLECYSTECTOMY
Anesthesia: General

## 2022-10-08 MED ORDER — CHLORHEXIDINE GLUCONATE 0.12 % MT SOLN
15.0000 mL | Freq: Once | OROMUCOSAL | Status: AC
  Administered 2022-10-08: 15 mL via OROMUCOSAL
  Filled 2022-10-08: qty 15

## 2022-10-08 MED ORDER — ROCURONIUM BROMIDE 10 MG/ML (PF) SYRINGE
PREFILLED_SYRINGE | INTRAVENOUS | Status: DC | PRN
  Administered 2022-10-08: 50 mg via INTRAVENOUS

## 2022-10-08 MED ORDER — ONDANSETRON HCL 4 MG/2ML IJ SOLN
INTRAMUSCULAR | Status: AC
  Filled 2022-10-08: qty 2

## 2022-10-08 MED ORDER — BUPIVACAINE LIPOSOME 1.3 % IJ SUSP
INTRAMUSCULAR | Status: DC | PRN
  Administered 2022-10-08 (×2): 10 mL via PERINEURAL

## 2022-10-08 MED ORDER — ONDANSETRON HCL 4 MG/2ML IJ SOLN
INTRAMUSCULAR | Status: DC | PRN
  Administered 2022-10-08: 4 mg via INTRAVENOUS

## 2022-10-08 MED ORDER — BUPIVACAINE-EPINEPHRINE (PF) 0.25% -1:200000 IJ SOLN
INTRAMUSCULAR | Status: DC | PRN
  Administered 2022-10-08 (×2): 20 mL via PERINEURAL

## 2022-10-08 MED ORDER — DEXAMETHASONE SODIUM PHOSPHATE 10 MG/ML IJ SOLN
INTRAMUSCULAR | Status: DC | PRN
  Administered 2022-10-08: 5 mg via INTRAVENOUS

## 2022-10-08 MED ORDER — ACETAMINOPHEN 500 MG PO TABS
1000.0000 mg | ORAL_TABLET | Freq: Once | ORAL | Status: AC
  Administered 2022-10-08: 1000 mg via ORAL
  Filled 2022-10-08: qty 2

## 2022-10-08 MED ORDER — FENTANYL CITRATE (PF) 100 MCG/2ML IJ SOLN
25.0000 ug | INTRAMUSCULAR | Status: DC | PRN
  Administered 2022-10-08 (×2): 50 ug via INTRAVENOUS

## 2022-10-08 MED ORDER — LIDOCAINE 2% (20 MG/ML) 5 ML SYRINGE
INTRAMUSCULAR | Status: AC
  Filled 2022-10-08: qty 5

## 2022-10-08 MED ORDER — CHLORHEXIDINE GLUCONATE CLOTH 2 % EX PADS: 6.0000 | MEDICATED_PAD | Freq: Once | CUTANEOUS | Status: DC

## 2022-10-08 MED ORDER — LACTATED RINGERS IV SOLN: INTRAVENOUS | Status: DC

## 2022-10-08 MED ORDER — FENTANYL CITRATE (PF) 100 MCG/2ML IJ SOLN
INTRAMUSCULAR | Status: AC
  Filled 2022-10-08: qty 2

## 2022-10-08 MED ORDER — PROPOFOL 10 MG/ML IV BOLUS
INTRAVENOUS | Status: DC | PRN
  Administered 2022-10-08: 100 mg via INTRAVENOUS

## 2022-10-08 MED ORDER — SUGAMMADEX SODIUM 200 MG/2ML IV SOLN
INTRAVENOUS | Status: DC | PRN
  Administered 2022-10-08: 200 mg via INTRAVENOUS

## 2022-10-08 MED ORDER — DEXAMETHASONE SODIUM PHOSPHATE 10 MG/ML IJ SOLN
INTRAMUSCULAR | Status: AC
  Filled 2022-10-08: qty 1

## 2022-10-08 MED ORDER — ENSURE PRE-SURGERY PO LIQD: 296.0000 mL | Freq: Once | ORAL | Status: DC

## 2022-10-08 MED ORDER — LABETALOL HCL 5 MG/ML IV SOLN
INTRAVENOUS | Status: AC
  Filled 2022-10-08: qty 4

## 2022-10-08 MED ORDER — LABETALOL HCL 5 MG/ML IV SOLN
5.0000 mg | Freq: Once | INTRAVENOUS | Status: AC
  Administered 2022-10-08: 5 mg via INTRAVENOUS

## 2022-10-08 MED ORDER — ROCURONIUM BROMIDE 10 MG/ML (PF) SYRINGE
PREFILLED_SYRINGE | INTRAVENOUS | Status: AC
  Filled 2022-10-08: qty 10

## 2022-10-08 MED ORDER — OXYCODONE HCL 5 MG/5ML PO SOLN
ORAL | Status: AC
  Filled 2022-10-08: qty 5

## 2022-10-08 MED ORDER — BUPIVACAINE-EPINEPHRINE 0.5% -1:200000 IJ SOLN
INTRAMUSCULAR | Status: DC | PRN
  Administered 2022-10-08: 10 mL

## 2022-10-08 MED ORDER — FENTANYL CITRATE (PF) 250 MCG/5ML IJ SOLN
INTRAMUSCULAR | Status: AC
  Filled 2022-10-08: qty 5

## 2022-10-08 MED ORDER — SPY AGENT GREEN - (INDOCYANINE FOR INJECTION)
1.2500 mg | Freq: Once | INTRAMUSCULAR | Status: AC
  Administered 2022-10-08: 2.5 mg via INTRAVENOUS
  Filled 2022-10-08: qty 10

## 2022-10-08 MED ORDER — BUPIVACAINE LIPOSOME 1.3 % IJ SUSP
INTRAMUSCULAR | Status: AC
  Filled 2022-10-08: qty 20

## 2022-10-08 MED ORDER — MIDAZOLAM HCL 2 MG/2ML IJ SOLN
INTRAMUSCULAR | Status: DC | PRN
  Administered 2022-10-08: 1 mg via INTRAVENOUS

## 2022-10-08 MED ORDER — BUPIVACAINE-EPINEPHRINE (PF) 0.5% -1:200000 IJ SOLN
INTRAMUSCULAR | Status: AC
  Filled 2022-10-08: qty 30

## 2022-10-08 MED ORDER — TRAMADOL HCL 50 MG PO TABS: 50.0000 mg | ORAL_TABLET | Freq: Four times a day (QID) | ORAL | 0 refills | Status: DC | PRN

## 2022-10-08 MED ORDER — OXYCODONE HCL 5 MG PO TABS: 5.0000 mg | ORAL_TABLET | Freq: Once | ORAL | Status: AC | PRN

## 2022-10-08 MED ORDER — PROPOFOL 10 MG/ML IV BOLUS
INTRAVENOUS | Status: AC
  Filled 2022-10-08: qty 20

## 2022-10-08 MED ORDER — FENTANYL CITRATE (PF) 100 MCG/2ML IJ SOLN
INTRAMUSCULAR | Status: DC | PRN
  Administered 2022-10-08 (×3): 50 ug via INTRAVENOUS

## 2022-10-08 MED ORDER — ORAL CARE MOUTH RINSE: 15.0000 mL | Freq: Once | OROMUCOSAL | Status: AC

## 2022-10-08 MED ORDER — CEFAZOLIN SODIUM-DEXTROSE 2-4 GM/100ML-% IV SOLN
2.0000 g | INTRAVENOUS | Status: AC
  Administered 2022-10-08: 2 g via INTRAVENOUS
  Filled 2022-10-08: qty 100

## 2022-10-08 MED ORDER — MIDAZOLAM HCL 2 MG/2ML IJ SOLN
INTRAMUSCULAR | Status: AC
  Filled 2022-10-08: qty 2

## 2022-10-08 MED ORDER — LIDOCAINE 2% (20 MG/ML) 5 ML SYRINGE
INTRAMUSCULAR | Status: DC | PRN
  Administered 2022-10-08: 60 mg via INTRAVENOUS

## 2022-10-08 MED ORDER — OXYCODONE HCL 5 MG/5ML PO SOLN
5.0000 mg | Freq: Once | ORAL | Status: AC | PRN
  Administered 2022-10-08: 5 mg via ORAL

## 2022-10-08 SURGICAL SUPPLY — 45 items
ADH SKN CLS APL DERMABOND .7 (GAUZE/BANDAGES/DRESSINGS) ×1
ADH SKN CLS LQ APL DERMABOND (GAUZE/BANDAGES/DRESSINGS) ×1
APL PRP STRL LF DISP 70% ISPRP (MISCELLANEOUS) ×1
APPLIER CLIP 5 13 M/L LIGAMAX5 (MISCELLANEOUS) ×1
APR CLP MED LRG 5 ANG JAW (MISCELLANEOUS) ×1
BAG COUNTER SPONGE SURGICOUNT (BAG) ×1 IMPLANT
BAG SPEC RTRVL 10 TROC 200 (ENDOMECHANICALS) ×1
BAG SPNG CNTER NS LX DISP (BAG) ×1
BLADE CLIPPER SURG (BLADE) IMPLANT
CANISTER SUCT 3000ML PPV (MISCELLANEOUS) ×1 IMPLANT
CHLORAPREP W/TINT 26 (MISCELLANEOUS) ×1 IMPLANT
CLIP APPLIE 5 13 M/L LIGAMAX5 (MISCELLANEOUS) ×1 IMPLANT
COVER SURGICAL LIGHT HANDLE (MISCELLANEOUS) ×1 IMPLANT
DERMABOND ADVANCED .7 DNX12 (GAUZE/BANDAGES/DRESSINGS) ×1 IMPLANT
DERMABOND ADVANCED .7 DNX6 (GAUZE/BANDAGES/DRESSINGS) IMPLANT
ELECT REM PT RETURN 9FT ADLT (ELECTROSURGICAL) ×1
ELECTRODE REM PT RTRN 9FT ADLT (ELECTROSURGICAL) ×1 IMPLANT
GLOVE BIO SURGEON STRL SZ7 (GLOVE) ×1 IMPLANT
GLOVE BIOGEL PI IND STRL 7.5 (GLOVE) ×1 IMPLANT
GOWN STRL REUS W/ TWL LRG LVL3 (GOWN DISPOSABLE) ×3 IMPLANT
GOWN STRL REUS W/TWL LRG LVL3 (GOWN DISPOSABLE) ×3
GRASPER SUT TROCAR 14GX15 (MISCELLANEOUS) ×1 IMPLANT
IRRIG SUCT STRYKERFLOW 2 WTIP (MISCELLANEOUS) ×1
IRRIGATION SUCT STRKRFLW 2 WTP (MISCELLANEOUS) ×1 IMPLANT
KIT BASIN OR (CUSTOM PROCEDURE TRAY) ×1 IMPLANT
KIT IMAGING PINPOINTPAQ (MISCELLANEOUS) IMPLANT
KIT TURNOVER KIT B (KITS) ×1 IMPLANT
NS IRRIG 1000ML POUR BTL (IV SOLUTION) ×1 IMPLANT
PAD ARMBOARD 7.5X6 YLW CONV (MISCELLANEOUS) ×1 IMPLANT
POUCH RETRIEVAL ECOSAC 10 (ENDOMECHANICALS) ×1 IMPLANT
SCISSORS LAP 5X35 DISP (ENDOMECHANICALS) ×1 IMPLANT
SET TUBE SMOKE EVAC HIGH FLOW (TUBING) ×1 IMPLANT
SLEEVE Z-THREAD 5X100MM (TROCAR) ×2 IMPLANT
SPECIMEN JAR SMALL (MISCELLANEOUS) ×1 IMPLANT
STRIP CLOSURE SKIN 1/2X4 (GAUZE/BANDAGES/DRESSINGS) ×1 IMPLANT
SUT MNCRL AB 4-0 PS2 18 (SUTURE) ×1 IMPLANT
SUT VICRYL 0 UR6 27IN ABS (SUTURE) ×1 IMPLANT
TAPE STRIPS DRAPE STRL (GAUZE/BANDAGES/DRESSINGS) IMPLANT
TOWEL GREEN STERILE (TOWEL DISPOSABLE) ×1 IMPLANT
TOWEL GREEN STERILE FF (TOWEL DISPOSABLE) ×1 IMPLANT
TRAY LAPAROSCOPIC MC (CUSTOM PROCEDURE TRAY) ×1 IMPLANT
TROCAR BALLN 12MMX100 BLUNT (TROCAR) ×1 IMPLANT
TROCAR Z-THREAD OPTICAL 5X100M (TROCAR) ×1 IMPLANT
WARMER LAPAROSCOPE (MISCELLANEOUS) ×1 IMPLANT
WATER STERILE IRR 1000ML POUR (IV SOLUTION) ×1 IMPLANT

## 2022-10-08 NOTE — Op Note (Signed)
Preoperative diagnosis: Cholelithiasis Postoperative diagnosis: Cholelithiasis Procedure:  Laparoscopic cholecystectomy Surgeon: Dr. Harden Mo Anesthesia: General Estimated blood loss: Minimal Specimens: Gallbladder and contents to pathology Complications: None Drains: None Sponge needle count was correct at completion Disposition to recovery stable condition   Indications:71 yom who is here with his wife. It is difficult to ascertain all his history. He has htn and kidney stones. He apparently has had an episode of ruq pain that radiated to his back. Had some nausea. He was seen in the er where he underwent ct scan that shows cholelithiasis without complicating features and a left ureteral stones. LFTs were normal. I recommended cholecystectomy as it appears that his symptoms were related to his gallbladder.    Procedure: After informed consent was obtained she was taken to the operating room.  He was given antibiotics.  SCDs were in place.  He was placed under general anesthesia without complication.  He was prepped and draped in the standard sterile surgical fashion.  Surgical timeout was then performed.   I infiltrated Marcaine below the umbilicus.  I made a vertical incision.  I incised the fascia and entered the peritoneum bluntly.  This was done without injury.  I then placed a 0 Vicryl pursestring suture through the fascia and introduced a Hassan trocar.  The abdomen was insufflated to 15 mmHg pressure.  I then placed 3 additional 5 mm trocars in the epigastrium and right upper quadrant without difficulty.  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  I did use ICG dye and was able to use this to confirm my dissection.  I very clearly saw the common bile duct and the critical view of safety.  I was able to clipped the artery 3 times and divided it.  I clipped the cystic duct three times and divided it leaving two in place.  I then removed the gallbladder from the liver bed.   I placed this in a retrieval bag and removed from the abdomen.  I obtained hemostasis.  I irrigated and this was clear.  I then removed the [facility] trocar and tied my pursestring down.  I placed 2 additional 0 Vicryl sutures through the fascia and closed this.  This completely obliterated the umbilical defect.  The remaining trocars were removed.  These were closed with 4-0 Monocryl and glue.  He tolerated this well was extubated and transferred to recovery stable.

## 2022-10-08 NOTE — Anesthesia Procedure Notes (Signed)
Anesthesia Regional Block: TAP block   Pre-Anesthetic Checklist: , timeout performed,  Correct Patient, Correct Site, Correct Laterality,  Correct Procedure, Correct Position, site marked,  Risks and benefits discussed,  Pre-op evaluation,  At surgeon's request and post-op pain management  Laterality: Left  Prep: Maximum Sterile Barrier Precautions used, chloraprep       Needles:  Injection technique: Single-shot  Needle Type: Echogenic Stimulator Needle     Needle Length: 9cm  Needle Gauge: 21     Additional Needles:   Narrative:  Start time: 10/08/2022 7:18 AM End time: 10/08/2022 7:20 AM Injection made incrementally with aspirations every 5 mL. Anesthesiologist: Kaylyn Layer, MD  Additional Notes: Risks, benefits, and alternative discussed. Patient gave consent for procedure. Patient prepped and draped in sterile fashion. Sedation administered, patient remains easily responsive to voice. Relevant anatomy identified with ultrasound guidance. Local anesthetic given in 5cc increments with no signs or symptoms of intravascular injection. No pain or paraesthesias with injection. Patient monitored throughout procedure with signs of LAST or immediate complications. Tolerated well. Ultrasound image placed in chart. Amalia Greenhouse, MD

## 2022-10-08 NOTE — Transfer of Care (Signed)
Immediate Anesthesia Transfer of Care Note  Patient: Alastor Tivnan  Procedure(s) Performed: LAPAROSCOPIC CHOLECYSTECTOMY  Patient Location: PACU  Anesthesia Type:General  Level of Consciousness: drowsy  Airway & Oxygen Therapy: Patient Spontanous Breathing and Patient connected to nasal cannula oxygen  Post-op Assessment: Report given to RN  Post vital signs: Reviewed and stable  Last Vitals:  Vitals Value Taken Time  BP 189/94 10/08/22 0825  Temp    Pulse 85 10/08/22 0826  Resp 17 10/08/22 0826  SpO2 94 % 10/08/22 0826  Vitals shown include unvalidated device data.  Last Pain:  Vitals:   10/08/22 0609  TempSrc:   PainSc: 0-No pain      Patients Stated Pain Goal: 0 (10/08/22 4098)  Complications: No notable events documented.

## 2022-10-08 NOTE — Interval H&P Note (Signed)
History and Physical Interval Note:  10/08/2022 7:09 AM  Andrew Lester  has presented today for surgery, with the diagnosis of GALLSTONES.  The various methods of treatment have been discussed with the patient and family. After consideration of risks, benefits and other options for treatment, the patient has consented to  Procedure(s): LAPAROSCOPIC CHOLECYSTECTOMY (N/A) as a surgical intervention.  [name] history has been reviewed, patient examined, no change in status, stable for surgery.  I have reviewed the patient's chart and labs.  Questions were answered to the patient's satisfaction.     [name]

## 2022-10-08 NOTE — Anesthesia Procedure Notes (Signed)
Anesthesia Regional Block: TAP block   Pre-Anesthetic Checklist: , timeout performed,  Correct Patient, Correct Site, Correct Laterality,  Correct Procedure, Correct Position, site marked,  Risks and benefits discussed,  Pre-op evaluation,  At surgeon's request and post-op pain management  Laterality: Right  Prep: Maximum Sterile Barrier Precautions used, chloraprep       Needles:  Injection technique: Single-shot  Needle Type: Echogenic Stimulator Needle     Needle Length: 9cm  Needle Gauge: 21     Additional Needles:   Narrative:  Start time: 10/08/2022 7:15 AM End time: 10/08/2022 7:18 AM Injection made incrementally with aspirations every 5 mL. Anesthesiologist: Kaylyn Layer, MD  Additional Notes: Risks, benefits, and alternative discussed. Patient gave consent for procedure. Patient prepped and draped in sterile fashion. Sedation administered, patient remains easily responsive to voice. Relevant anatomy identified with ultrasound guidance. Local anesthetic given in 5cc increments with no signs or symptoms of intravascular injection. No pain or paraesthesias with injection. Patient monitored throughout procedure with signs of LAST or immediate complications. Tolerated well. Ultrasound image placed in chart. Amalia Greenhouse, MD

## 2022-10-08 NOTE — Anesthesia Procedure Notes (Signed)
Procedure Name: Intubation Date/Time: 10/08/2022 7:37 AM  Performed by: De Nurse, CRNAPre-anesthesia Checklist: Patient identified, Emergency Drugs available, Suction available and Patient being monitored Patient Re-evaluated:Patient Re-evaluated prior to induction Oxygen Delivery Method: Circle System Utilized Preoxygenation: Pre-oxygenation with 100% oxygen Induction Type: IV induction Ventilation: Mask ventilation without difficulty Laryngoscope Size: Mac and 4 Grade View: Grade I Tube type: Oral Tube size: 7.5 mm Number of attempts: 1 Airway Equipment and Method: Stylet and Oral airway Placement Confirmation: ETT inserted through vocal cords under direct vision, positive ETCO2 and breath sounds checked- equal and bilateral Secured at: 22 cm Tube secured with: Tape Dental Injury: Teeth and Oropharynx as per pre-operative assessment

## 2022-10-08 NOTE — Anesthesia Postprocedure Evaluation (Signed)
Anesthesia Post Note  Patient: Andrew Lester  Procedure(s) Performed: LAPAROSCOPIC CHOLECYSTECTOMY     Patient location during evaluation: PACU Anesthesia Type: General Level of consciousness: awake and alert Pain management: pain level controlled Vital Signs Assessment: post-procedure vital signs reviewed and stable Respiratory status: spontaneous breathing, nonlabored ventilation and respiratory function stable Cardiovascular status: blood pressure returned to baseline Postop Assessment: no apparent nausea or vomiting Anesthetic complications: no   No notable events documented.  Last Vitals:  Vitals:   10/08/22 0900 10/08/22 0915  BP: (!) 204/102 (!) 176/91  Pulse: 76 70  Resp: 16 12  Temp:  36.7 C  SpO2: 94% 95%    Last Pain:  Vitals:   10/08/22 0900  TempSrc:   PainSc: Asleep                 Shanda Howells

## 2022-10-08 NOTE — H&P (Signed)
  87 yom who is here with his wife. It is difficult to ascertain all his history. He has htn and kidney stones. He apparently has had an episode of ruq pain that radiated to his back. Had some nausea. Dont think this has happened multiple times but difficult to tell. He was seen in the er where he underwent ct scan that shows cholelithiasis without complicating features and a left ureteral stones. LFTs were normal. He has been fine since then and comes in to discuss options  Review of Systems: A complete review of systems was obtained from the patient. I have reviewed this information and discussed as appropriate with the patient. See HPI as well for other ROS.  Review of Systems HENT: Positive for congestion. Psychiatric/Behavioral: Positive for memory loss. All other systems reviewed and are negative.  Medical History: History reviewed. No pertinent past medical history.  There is no problem list on file for this patient.  History reviewed. No pertinent surgical history.  No current outpatient medications on file prior to visit.  History reviewed. No pertinent family history.  Social History  Tobacco Use Smoking Status Never Smokeless Tobacco Never Marital status: Married Tobacco Use Smoking status: Never Smokeless tobacco: Never Substance and Sexual Activity Alcohol use: Not Currently Drug use: Not Currently  Objective:  Vitals: 09/06/22 1551 BP: (!) 140/80 Pulse: (!) 140 Weight: 67.1 kg (148 lb) Height: 154.9 cm (5\' 1" )  Body mass index is 27.96 kg/m.  Physical Exam Vitals reviewed. Constitutional: Appearance: Normal appearance. Abdominal: General: There is no distension. Tenderness: There is no abdominal tenderness. Neurological: Mental Status: He is alert.   Assessment and Plan: Gallstones  It is somewhat difficult due to memory issues but it sounds like this episode was related to his gallbladder. I think reasonable to consider cholecystectomy. This  obviously would be general anesthesia which could certainly have an effect on memory issues he is having. They are going to consider options and call me back. I did recommend cholecystectomy  I discussed the procedure in detail.  We discussed the risks and benefits of a laparoscopic cholecystectomy and possible cholangiogram including, but not limited to bleeding, infection, injury to surrounding structures such as the intestine or liver, bile leak, retained gallstones, need to convert to an open procedure, prolonged diarrhea, blood clots such as  DVT, common bile duct injury, anesthesia risks, and possible need for additional procedures.  The likelihood of improvement in symptoms and return to the patient's normal status is good. We discussed the typical post-operative recovery course.

## 2022-10-08 NOTE — Discharge Instructions (Signed)
[facility] LAPAROSCOPIC SURGERY: POST OP INSTRUCTIONS  Always review your discharge instruction sheet given to you by the facility where your surgery was performed. IF YOU HAVE DISABILITY OR FAMILY LEAVE FORMS, YOU MUST BRING THEM TO THE OFFICE FOR PROCESSING.   DO NOT GIVE THEM TO YOUR DOCTOR.  A prescription for pain medication may be given to you upon discharge.  Take your pain medication as prescribed, if needed.  If narcotic pain medicine is not needed, then you may take acetaminophen (Tylenol), naprosyn (Alleve), or ibuprofen (Advil) as needed. Take your usually prescribed medications unless otherwise directed. If you need a refill on your pain medication, please contact your pharmacy.  They will contact our office to request authorization. Prescriptions will not be filled after 5pm or on week-ends. You should follow a light diet the first few days after arrival home, such as soup and crackers, etc.  Be sure to include lots of fluids daily. Most patients will experience some swelling and bruising in the area of the incisions.  Ice packs will help.  Swelling and bruising can take several days to resolve.  It is common to experience some constipation if taking pain medication after surgery.  Increasing fluid intake and taking a stool softener (such as Colace) will usually help or prevent this problem from occurring.  A mild laxative (Milk of Magnesia or Miralax) should be taken according to package instructions if there are no bowel movements after 48 hours. Unless discharge instructions indicate otherwise, you may remove your bandages 48 hours after surgery, and you may shower at that time.  You may have steri-strips (small skin tapes) in place directly over the incision.  These strips should be left on the skin for 7-10 days.  If your surgeon used skin glue on the incision, you may shower in 24 hours.  The glue will flake off over the next  2-3 weeks.  Any sutures or staples will be removed at the office during your follow-up visit. ACTIVITIES:  You may resume regular (light) daily activities beginning the next day--such as daily self-care, walking, climbing stairs--gradually increasing activities as tolerated.  You may have sexual intercourse when it is comfortable.  Refrain from any heavy lifting or straining until approved by your doctor. You may drive when you are no longer taking prescription pain medication, you can comfortably wear a seatbelt, and you can safely maneuver your car and apply brakes. RETURN TO WORK:  __________________________________________________________ You should see your doctor in the office for a follow-up appointment approximately 2-3 weeks after your surgery.  Make sure that you call for this appointment within a day or two after you arrive home to insure a convenient appointment time. OTHER INSTRUCTIONS: __________________________________________________________________________________________________________________________ __________________________________________________________________________________________________________________________ WHEN TO CALL YOUR DOCTOR: Fever over 101.0 Inability to urinate Continued bleeding from incision. Increased pain, redness, or drainage from the incision. Increasing abdominal pain  The clinic staff is available to answer your questions during regular business hours.  Please don't hesitate to call and ask to speak to one of the nurses for clinical concerns.  If you have a medical emergency, go to the nearest emergency room or call 911.  A surgeon from [facility] is always on call at the hospital. [address], [address], [place], [place]  [address] ? [address] [address], [place], [place]   [address] [phone] ? [phone] ? FAX [phone] Web site: [email or web address]  

## 2022-10-09 ENCOUNTER — Encounter (HOSPITAL_COMMUNITY): Payer: Self-pay | Admitting: General Surgery

## 2022-10-09 LAB — SURGICAL PATHOLOGY

## 2023-09-12 ENCOUNTER — Emergency Department (HOSPITAL_COMMUNITY)

## 2023-09-12 ENCOUNTER — Other Ambulatory Visit: Payer: Self-pay

## 2023-09-12 ENCOUNTER — Encounter (HOSPITAL_COMMUNITY): Payer: Self-pay

## 2023-09-12 ENCOUNTER — Emergency Department (HOSPITAL_COMMUNITY)
Admission: EM | Admit: 2023-09-12 | Discharge: 2023-09-13 | Disposition: A | Discharge status: Home / Self Care | Attending: Emergency Medicine | Admitting: Emergency Medicine

## 2023-09-12 DIAGNOSIS — R339 Retention of urine, unspecified: Secondary | ICD-10-CM

## 2023-09-12 DIAGNOSIS — F0390 Unspecified dementia without behavioral disturbance: Secondary | ICD-10-CM | POA: Insufficient documentation

## 2023-09-12 DIAGNOSIS — R55 Syncope and collapse: Secondary | ICD-10-CM | POA: Insufficient documentation

## 2023-09-12 LAB — CBC WITH DIFFERENTIAL/PLATELET
Abs Immature Granulocytes: 0.02 10*3/uL (ref 0.00–0.07)
Basophils Absolute: 0.1 10*3/uL (ref 0.0–0.1)
Basophils Relative: 1 %
Eosinophils Absolute: 0.2 10*3/uL (ref 0.0–0.5)
Eosinophils Relative: 3 %
HCT: 44.4 % (ref 39.0–52.0)
Hemoglobin: 15 g/dL (ref 13.0–17.0)
Immature Granulocytes: 0 %
Lymphocytes Relative: 26 %
Lymphs Abs: 2.1 10*3/uL (ref 0.7–4.0)
MCH: 31.2 pg (ref 26.0–34.0)
MCHC: 33.8 g/dL (ref 30.0–36.0)
MCV: 92.3 fL (ref 80.0–100.0)
Monocytes Absolute: 0.8 10*3/uL (ref 0.1–1.0)
Monocytes Relative: 11 %
Neutro Abs: 4.6 10*3/uL (ref 1.7–7.7)
Neutrophils Relative %: 59 %
Platelets: 182 10*3/uL (ref 150–400)
RBC: 4.81 MIL/uL (ref 4.22–5.81)
RDW: 13.3 % (ref 11.5–15.5)
WBC: 7.8 10*3/uL (ref 4.0–10.5)
nRBC: 0 % (ref 0.0–0.2)

## 2023-09-12 LAB — CBG MONITORING, ED: Glucose-Capillary: 97 mg/dL (ref 70–99)

## 2023-09-12 NOTE — ED Provider Notes (Signed)
 [place] [facility] AT [facility] Provider Note   CSN: [id number] Arrival date & time: 09/12/23  2138     History  Chief Complaint  Patient presents with   Unresponsive    Elazar Argabright is a 73 y.o. male.  Patient is a 73 year old male with past medical history of dementia presenting to the emergency department after syncopal episode.  Per EMS, the patient was sitting on the couch and about to get ready for bed when he stood up and then collapsed back onto the couch and went unresponsive.  The family reported that he was breathing and they called 911.  EMS states that he was still unresponsive on their arrival and did not respond to noxious stimuli.  They state that and route here he slowly started to come to.  They report no seizure-like activity seen.  I spoke to his wife and reports she was helping him sit down. Once he sat down he said "you hurt me" and "I feel like I'm bleeding in my pants". She asked if he needed to go to the bathroom and then he went unresponsive. Has not been sick recently. Does have a history of dementia but doesn't take meds. Normally A&O x1 at baseline, often doesn't recognize wife. Has not had any chest pain, shortness of breath, fever. Last week he had a similar episode where he fell down to her feet. She told him "time to get up" and he opened his eyes and said "he's fine".   The history is provided by the spouse and the EMS personnel.       Home Medications Prior to Admission medications   Medication Sig Start Date End Date Taking? Authorizing Provider  Oxymetazoline HCl (SINEX LONG-ACTING NA) Place 1 spray into the nose daily as needed (congestion).    [provider]  traMADol  (ULTRAM ) 50 MG tablet Take 1 tablet (50 mg total) by mouth every 6 (six) hours as needed. 10/08/22   Enid Harry, MD      Allergies    Amoxicillin and Erythromycin    Review of Systems   Review of Systems  Physical Exam Updated Vital  Signs BP (!) 156/84 (BP Location: Right Arm)   Pulse 87   Temp 98.4 F (36.9 C) (Axillary)   Resp (!) 23   SpO2 100%  Physical Exam Vitals and nursing note reviewed.  Constitutional:      General: He is not in acute distress.    Appearance: Normal appearance.  HENT:     Head: Normocephalic and atraumatic.     Nose: Nose normal.     Mouth/Throat:     Mouth: Mucous membranes are moist.     Pharynx: Oropharynx is clear.  Eyes:     Extraocular Movements: Extraocular movements intact.     Conjunctiva/sclera: Conjunctivae normal.     Pupils: Pupils are equal, round, and reactive to light.  Cardiovascular:     Rate and Rhythm: Normal rate and regular rhythm.     Heart sounds: Normal heart sounds.  Pulmonary:     Effort: Pulmonary effort is normal.     Breath sounds: Normal breath sounds.  Abdominal:     General: Abdomen is flat.     Palpations: Abdomen is soft.  Musculoskeletal:        General: No deformity or signs of injury. Normal range of motion.     Cervical back: Normal range of motion.     Right lower leg: No edema.  Left lower leg: No edema.  Skin:    General: Skin is warm and dry.  Neurological:     General: No focal deficit present.     Mental Status: He is alert.     Sensory: No sensory deficit.     Motor: No weakness.     Comments: Oriented to person only  Psychiatric:        Mood and Affect: Mood normal.        Behavior: Behavior normal.     ED Results / Procedures / Treatments   Labs (all labs ordered are listed, but only abnormal results are displayed) Labs Reviewed  CBC WITH DIFFERENTIAL/PLATELET  URINALYSIS, W/ REFLEX TO CULTURE (INFECTION SUSPECTED)  COMPREHENSIVE METABOLIC PANEL WITH GFR  CBG MONITORING, ED    EKG EKG Interpretation Date/Time:  Thursday September 12 2023 21:44:23 EDT Ventricular Rate:  86 PR Interval:  149 QRS Duration:  120 QT Interval:  373 QTC Calculation: 447 R Axis:   55  Text Interpretation: Sinus rhythm  Ventricular premature complex Nonspecific intraventricular conduction delay PVC otherwise unchanged from prior EKG Confirmed by Celesta Coke ([id number]) on 09/12/2023 9:51:10 PM  Radiology DG Chest Port 1 View Result Date: 09/12/2023 CLINICAL DATA:  syncope EXAM: PORTABLE CHEST - 1 VIEW COMPARISON:  08/22/2022. FINDINGS: Cardiac silhouette is unremarkable. No pneumothorax or pleural effusion. The lungs are clear. Aorta is calcified. There are thoracic degenerative changes. IMPRESSION: No acute cardiopulmonary process. Electronically Signed   By: Sydell Eva M.D.   On: 09/12/2023 23:02    Procedures Procedures    Medications Ordered in ED Medications - No data to display  ED Course/ Medical Decision Making/ A&P Clinical Course as of 09/12/23 2305  Thu Sep 12, 2023  2304 Patient signed out to Dr. Morris Arch pending labs and [facility]. [VK]    Clinical Course User Index [VK] Kingsley, Tiannah Greenly K, DO                                 Medical Decision Making This patient presents to the ED with chief complaint(s) of syncope with pertinent past medical history of dementia which further complicates the presenting complaint. The complaint involves an extensive differential diagnosis and also carries with it a high risk of complications and morbidity.    The differential diagnosis includes arrhythmia, anemia, dehydration, electrolyte abnormality, ICH/mass effect, no focal deficits making CVA unlikely, seizure, infection   Additional history obtained: Additional history obtained from spouse and EMS  Records reviewed outpatient PMR records  ED Course and Reassessment: On patient's arrival he is hemodynamically stable, in no acute distress and back to his neurologic baseline.  EKG on arrival showed normal sinus rhythm with occasional PVCs.  Patient will have workup including labs, urine and head CT.  Will be monitored on telemetry and will be closely reassessed.  Independent labs interpretation:   The following labs were independently interpreted: CBC normal, labs otherwise pending  Independent visualization of imaging: - Pending    Amount and/or Complexity of Data Reviewed Labs: ordered. Radiology: ordered.          Final Clinical Impression(s) / ED Diagnoses Final diagnoses:  Syncope, unspecified syncope type    Rx / DC Orders ED Discharge Orders     None         Kingsley, Shereena Berquist K, DO 09/12/23 2305

## 2023-09-12 NOTE — ED Notes (Signed)
 CMP recollected and resent to lab.

## 2023-09-12 NOTE — ED Triage Notes (Signed)
 Pt bib [facility] coming from home. Pt was sitting on couch, stood up and immediately collapsed back into couch. Family reports that a similar episode like this happened but pt was able to come back to baseline immediately. However this pt has stayed unresponsive. No initial pain response with EMS, couldn't get patient to follow commands. No known unilaterally deficits. 12 lead unremarkable, some PACS/PVCS. Pt has hx of alzheimer's.  EMS VS: 160/90 82 HR 98% RA 22 144 CBG 20 left forearm

## 2023-09-13 LAB — URINALYSIS, W/ REFLEX TO CULTURE (INFECTION SUSPECTED)
Bilirubin Urine: NEGATIVE
Glucose, UA: NEGATIVE mg/dL
Hgb urine dipstick: NEGATIVE
Ketones, ur: NEGATIVE mg/dL
Nitrite: NEGATIVE
Protein, ur: NEGATIVE mg/dL
Specific Gravity, Urine: 1.005 (ref 1.005–1.030)
pH: 7 (ref 5.0–8.0)

## 2023-09-13 LAB — COMPREHENSIVE METABOLIC PANEL WITH GFR
ALT: 12 U/L (ref 0–44)
AST: 22 U/L (ref 15–41)
Albumin: 3.5 g/dL (ref 3.5–5.0)
Alkaline Phosphatase: 33 U/L — ABNORMAL LOW (ref 38–126)
Anion gap: 11 (ref 5–15)
BUN: 16 mg/dL (ref 8–23)
CO2: 26 mmol/L (ref 22–32)
Calcium: 8.7 mg/dL — ABNORMAL LOW (ref 8.9–10.3)
Chloride: 102 mmol/L (ref 98–111)
Creatinine, Ser: 0.97 mg/dL (ref 0.61–1.24)
GFR, Estimated: >60 mL/min (ref >60)
Glucose, Bld: 111 mg/dL — ABNORMAL HIGH (ref 70–99)
Potassium: 4.3 mmol/L (ref 3.5–5.1)
Sodium: 139 mmol/L (ref 135–145)
Total Bilirubin: 1.1 mg/dL (ref 0.0–1.2)
Total Protein: 5.8 g/dL — ABNORMAL LOW (ref 6.5–8.1)

## 2023-09-13 NOTE — ED Notes (Signed)
 Pt urinated large amount in trash can.

## 2023-09-13 NOTE — ED Provider Notes (Signed)
 I assumed care of this patient from previous provider.  Please see their note for further details of history, exam, and MDM.   Briefly patient is a 73 y.o. male who presented after syncope. Pending labs and imaging.  Work up reassuring. Patient noted to have urinary retention. Long discussion with family regarding foley catheter placement resulted in shared decision to refrain from placing it at this time. Output measurements and Urology follow up recommended.  The patient appears reasonably screened and/or stabilized for discharge and I doubt any other medical condition or other [facility] requiring further screening, evaluation, or treatment in the ED at this time. I have discussed the findings, Dx and Tx plan with the patient/family who expressed understanding and agree(s) with the plan. Discharge instructions discussed at length. The patient/family was given strict return precautions who verbalized understanding of the instructions. No further questions at time of discharge.  Disposition: Discharge  Condition: Good  ED Discharge Orders     None       Follow Up: Jimmey Mould, MD [address] [place] [address] [phone]  Call  to schedule an appointment for close follow up  Lahoma Pigg, MD [address] [place] [address] [phone]  Call  to schedule an appointment for close follow up         Lindle Rhea, MD 09/13/23 [phone]

## 2023-09-13 NOTE — ED Notes (Signed)
 Bladder scan volume - . Pt given urinal and encouraged to void.

## 2024-01-19 ENCOUNTER — Other Ambulatory Visit: Payer: Self-pay

## 2024-01-19 ENCOUNTER — Emergency Department (HOSPITAL_COMMUNITY)

## 2024-01-19 ENCOUNTER — Inpatient Hospital Stay (HOSPITAL_COMMUNITY)

## 2024-01-19 ENCOUNTER — Encounter (HOSPITAL_COMMUNITY): Payer: Self-pay

## 2024-01-19 ENCOUNTER — Encounter (HOSPITAL_COMMUNITY)
Admission: EM | Disposition: A | Discharge status: Home Health | Payer: Self-pay | Source: Home / Self Care | Attending: Internal Medicine

## 2024-01-19 ENCOUNTER — Inpatient Hospital Stay (HOSPITAL_COMMUNITY)
Admission: EM | Admit: 2024-01-19 | Discharge: 2024-01-22 | DRG: 480 | Disposition: A | Discharge status: Home Health | Attending: Family Medicine | Admitting: Family Medicine

## 2024-01-19 DIAGNOSIS — W010XXA Fall on same level from slipping, tripping and stumbling without subsequent striking against object, initial encounter: Principal | ICD-10-CM | POA: Diagnosis present

## 2024-01-19 DIAGNOSIS — Y92008 Other place in unspecified non-institutional (private) residence as the place of occurrence of the external cause: Secondary | ICD-10-CM | POA: Diagnosis not present

## 2024-01-19 DIAGNOSIS — Z803 Family history of malignant neoplasm of breast: Secondary | ICD-10-CM

## 2024-01-19 DIAGNOSIS — Z880 Allergy status to penicillin: Secondary | ICD-10-CM | POA: Diagnosis not present

## 2024-01-19 DIAGNOSIS — F0390 Unspecified dementia without behavioral disturbance: Secondary | ICD-10-CM | POA: Diagnosis present

## 2024-01-19 DIAGNOSIS — Z833 Family history of diabetes mellitus: Secondary | ICD-10-CM

## 2024-01-19 DIAGNOSIS — Z8249 Family history of ischemic heart disease and other diseases of the circulatory system: Secondary | ICD-10-CM

## 2024-01-19 DIAGNOSIS — Z6824 Body mass index (BMI) 24.0-24.9, adult: Secondary | ICD-10-CM | POA: Diagnosis not present

## 2024-01-19 DIAGNOSIS — M109 Gout, unspecified: Secondary | ICD-10-CM | POA: Diagnosis present

## 2024-01-19 DIAGNOSIS — Y92009 Unspecified place in unspecified non-institutional (private) residence as the place of occurrence of the external cause: Secondary | ICD-10-CM

## 2024-01-19 DIAGNOSIS — E43 Unspecified severe protein-calorie malnutrition: Secondary | ICD-10-CM | POA: Diagnosis present

## 2024-01-19 DIAGNOSIS — S72141A Displaced intertrochanteric fracture of right femur, initial encounter for closed fracture: Secondary | ICD-10-CM

## 2024-01-19 DIAGNOSIS — I10 Essential (primary) hypertension: Secondary | ICD-10-CM

## 2024-01-19 DIAGNOSIS — R7303 Prediabetes: Secondary | ICD-10-CM | POA: Diagnosis present

## 2024-01-19 DIAGNOSIS — N400 Enlarged prostate without lower urinary tract symptoms: Secondary | ICD-10-CM | POA: Diagnosis present

## 2024-01-19 DIAGNOSIS — Z820 Family history of epilepsy and other diseases of the nervous system: Secondary | ICD-10-CM

## 2024-01-19 DIAGNOSIS — W19XXXA Unspecified fall, initial encounter: Secondary | ICD-10-CM | POA: Diagnosis not present

## 2024-01-19 DIAGNOSIS — M80051A Age-related osteoporosis with current pathological fracture, right femur, initial encounter for fracture: Secondary | ICD-10-CM | POA: Diagnosis present

## 2024-01-19 DIAGNOSIS — M25551 Pain in right hip: Secondary | ICD-10-CM | POA: Diagnosis present

## 2024-01-19 DIAGNOSIS — R296 Repeated falls: Secondary | ICD-10-CM | POA: Diagnosis present

## 2024-01-19 HISTORY — PX: INTRAMEDULLARY (IM) NAIL INTERTROCHANTERIC: SHX5875

## 2024-01-19 LAB — BASIC METABOLIC PANEL WITH GFR
Anion gap: 11 (ref 5–15)
BUN: 10 mg/dL (ref 8–23)
CO2: 25 mmol/L (ref 22–32)
Calcium: 9 mg/dL (ref 8.9–10.3)
Chloride: 102 mmol/L (ref 98–111)
Creatinine, Ser: 1.06 mg/dL (ref 0.61–1.24)
GFR, Estimated: >60 mL/min (ref >60)
Glucose, Bld: 148 mg/dL — ABNORMAL HIGH (ref 70–99)
Potassium: 4 mmol/L (ref 3.5–5.1)
Sodium: 138 mmol/L (ref 135–145)

## 2024-01-19 LAB — CBC
HCT: 40.2 % (ref 39.0–52.0)
Hemoglobin: 13.4 g/dL (ref 13.0–17.0)
MCH: 30.9 pg (ref 26.0–34.0)
MCHC: 33.3 g/dL (ref 30.0–36.0)
MCV: 92.8 fL (ref 80.0–100.0)
Platelets: 174 K/uL (ref 150–400)
RBC: 4.33 MIL/uL (ref 4.22–5.81)
RDW: 13.5 % (ref 11.5–15.5)
WBC: 8.8 K/uL (ref 4.0–10.5)
nRBC: 0 % (ref 0.0–0.2)

## 2024-01-19 LAB — TYPE AND SCREEN
ABO/RH(D): A POS
Antibody Screen: NEGATIVE

## 2024-01-19 LAB — ABO/RH: ABO/RH(D): A POS

## 2024-01-19 LAB — GLUCOSE, CAPILLARY: Glucose-Capillary: 138 mg/dL — ABNORMAL HIGH (ref 70–99)

## 2024-01-19 SURGERY — FIXATION, FRACTURE, INTERTROCHANTERIC, WITH INTRAMEDULLARY ROD
Anesthesia: General | Laterality: Right

## 2024-01-19 MED ORDER — ONDANSETRON HCL 4 MG/2ML IJ SOLN: 4.0000 mg | Freq: Four times a day (QID) | INTRAMUSCULAR | Status: DC | PRN

## 2024-01-19 MED ORDER — LIDOCAINE 2% (20 MG/ML) 5 ML SYRINGE
INTRAMUSCULAR | Status: DC | PRN
  Administered 2024-01-19: 60 mg via INTRAVENOUS

## 2024-01-19 MED ORDER — PHENYLEPHRINE 80 MCG/ML (10ML) SYRINGE FOR IV PUSH (FOR BLOOD PRESSURE SUPPORT)
PREFILLED_SYRINGE | INTRAVENOUS | Status: DC | PRN
  Administered 2024-01-19 (×5): 160 ug via INTRAVENOUS

## 2024-01-19 MED ORDER — CEFAZOLIN SODIUM-DEXTROSE 2-4 GM/100ML-% IV SOLN: 2.0000 g | Freq: Once | INTRAVENOUS | Status: DC

## 2024-01-19 MED ORDER — ORAL CARE MOUTH RINSE: 15.0000 mL | Freq: Once | OROMUCOSAL | Status: AC

## 2024-01-19 MED ORDER — FENTANYL CITRATE (PF) 100 MCG/2ML IJ SOLN
25.0000 ug | INTRAMUSCULAR | Status: DC | PRN
  Administered 2024-01-19: 50 ug via INTRAVENOUS

## 2024-01-19 MED ORDER — CEFAZOLIN SODIUM 1 G IJ SOLR
INTRAMUSCULAR | Status: AC
Start: 2024-01-19 — End: 2024-01-19
  Filled 2024-01-19: qty 40

## 2024-01-19 MED ORDER — DEXAMETHASONE SODIUM PHOSPHATE 10 MG/ML IJ SOLN
INTRAMUSCULAR | Status: DC | PRN
  Administered 2024-01-19: 5 mg via INTRAVENOUS

## 2024-01-19 MED ORDER — CHLORHEXIDINE GLUCONATE 0.12 % MT SOLN
OROMUCOSAL | Status: AC
  Filled 2024-01-19: qty 15

## 2024-01-19 MED ORDER — TRANEXAMIC ACID-NACL 1000-0.7 MG/100ML-% IV SOLN
1000.0000 mg | Freq: Once | INTRAVENOUS | Status: AC
  Administered 2024-01-19: 1000 mg via INTRAVENOUS
  Filled 2024-01-19: qty 100

## 2024-01-19 MED ORDER — ACETAMINOPHEN 10 MG/ML IV SOLN
INTRAVENOUS | Status: DC | PRN
  Administered 2024-01-19: 1000 mg via INTRAVENOUS

## 2024-01-19 MED ORDER — PROPOFOL 10 MG/ML IV BOLUS
INTRAVENOUS | Status: AC
  Filled 2024-01-19: qty 20

## 2024-01-19 MED ORDER — ONDANSETRON HCL 4 MG PO TABS: 4.0000 mg | ORAL_TABLET | Freq: Four times a day (QID) | ORAL | Status: DC | PRN

## 2024-01-19 MED ORDER — ONDANSETRON HCL 4 MG/2ML IJ SOLN
INTRAMUSCULAR | Status: AC
  Filled 2024-01-19: qty 2

## 2024-01-19 MED ORDER — EPHEDRINE 5 MG/ML INJ
INTRAVENOUS | Status: AC
  Filled 2024-01-19: qty 10

## 2024-01-19 MED ORDER — PHENOL 1.4 % MT LIQD: 1.0000 | OROMUCOSAL | Status: DC | PRN

## 2024-01-19 MED ORDER — PROPOFOL 10 MG/ML IV BOLUS
INTRAVENOUS | Status: DC | PRN
  Administered 2024-01-19: 100 mg via INTRAVENOUS

## 2024-01-19 MED ORDER — METOCLOPRAMIDE HCL 5 MG PO TABS: 5.0000 mg | ORAL_TABLET | Freq: Three times a day (TID) | ORAL | Status: DC | PRN

## 2024-01-19 MED ORDER — FENTANYL CITRATE (PF) 250 MCG/5ML IJ SOLN
INTRAMUSCULAR | Status: DC | PRN
  Administered 2024-01-19: 100 ug via INTRAVENOUS

## 2024-01-19 MED ORDER — DOCUSATE SODIUM 100 MG PO CAPS: 100.0000 mg | ORAL_CAPSULE | Freq: Two times a day (BID) | ORAL | Status: DC

## 2024-01-19 MED ORDER — LACTATED RINGERS IV SOLN: INTRAVENOUS | Status: DC

## 2024-01-19 MED ORDER — ENOXAPARIN SODIUM 40 MG/0.4ML IJ SOSY
40.0000 mg | PREFILLED_SYRINGE | INTRAMUSCULAR | Status: DC
  Administered 2024-01-19 – 2024-01-21 (×3): 40 mg via SUBCUTANEOUS
  Filled 2024-01-19 (×3): qty 0.4

## 2024-01-19 MED ORDER — ONDANSETRON HCL 4 MG/2ML IJ SOLN
INTRAMUSCULAR | Status: DC | PRN
  Administered 2024-01-19: 4 mg via INTRAVENOUS

## 2024-01-19 MED ORDER — ACETAMINOPHEN 10 MG/ML IV SOLN: 1000.0000 mg | Freq: Once | INTRAVENOUS | Status: DC | PRN

## 2024-01-19 MED ORDER — CEFAZOLIN SODIUM-DEXTROSE 2-4 GM/100ML-% IV SOLN
2.0000 g | Freq: Four times a day (QID) | INTRAVENOUS | Status: AC
  Administered 2024-01-19 – 2024-01-20 (×2): 2 g via INTRAVENOUS
  Filled 2024-01-19 (×2): qty 100

## 2024-01-19 MED ORDER — SUCCINYLCHOLINE CHLORIDE 200 MG/10ML IV SOSY
PREFILLED_SYRINGE | INTRAVENOUS | Status: AC
  Filled 2024-01-19: qty 10

## 2024-01-19 MED ORDER — MORPHINE SULFATE (PF) 2 MG/ML IV SOLN
2.0000 mg | Freq: Once | INTRAVENOUS | Status: AC
  Administered 2024-01-19: 2 mg via INTRAVENOUS
  Filled 2024-01-19: qty 1

## 2024-01-19 MED ORDER — FENTANYL CITRATE (PF) 250 MCG/5ML IJ SOLN
INTRAMUSCULAR | Status: AC
  Filled 2024-01-19: qty 5

## 2024-01-19 MED ORDER — CHLORHEXIDINE GLUCONATE 0.12 % MT SOLN
15.0000 mL | Freq: Once | OROMUCOSAL | Status: AC
  Administered 2024-01-19: 15 mL via OROMUCOSAL

## 2024-01-19 MED ORDER — SODIUM CHLORIDE 0.9 % IV SOLN: INTRAVENOUS | Status: DC

## 2024-01-19 MED ORDER — METOCLOPRAMIDE HCL 5 MG/ML IJ SOLN: 5.0000 mg | Freq: Three times a day (TID) | INTRAMUSCULAR | Status: DC | PRN

## 2024-01-19 MED ORDER — PHENYLEPHRINE 80 MCG/ML (10ML) SYRINGE FOR IV PUSH (FOR BLOOD PRESSURE SUPPORT)
PREFILLED_SYRINGE | INTRAVENOUS | Status: AC
  Filled 2024-01-19: qty 30

## 2024-01-19 MED ORDER — MORPHINE SULFATE (PF) 2 MG/ML IV SOLN: 0.5000 mg | INTRAVENOUS | Status: DC | PRN

## 2024-01-19 MED ORDER — ROCURONIUM BROMIDE 10 MG/ML (PF) SYRINGE
PREFILLED_SYRINGE | INTRAVENOUS | Status: DC | PRN
  Administered 2024-01-19: 50 mg via INTRAVENOUS

## 2024-01-19 MED ORDER — DROPERIDOL 2.5 MG/ML IJ SOLN: 0.6250 mg | Freq: Once | INTRAMUSCULAR | Status: DC | PRN

## 2024-01-19 MED ORDER — SUGAMMADEX SODIUM 200 MG/2ML IV SOLN
INTRAVENOUS | Status: DC | PRN
  Administered 2024-01-19 (×2): 100 mg via INTRAVENOUS

## 2024-01-19 MED ORDER — OXYCODONE HCL 5 MG PO TABS: 5.0000 mg | ORAL_TABLET | Freq: Once | ORAL | Status: DC | PRN

## 2024-01-19 MED ORDER — HYDROCODONE-ACETAMINOPHEN 5-325 MG PO TABS
1.0000 | ORAL_TABLET | Freq: Four times a day (QID) | ORAL | Status: DC | PRN
  Administered 2024-01-20: 2 via ORAL
  Filled 2024-01-19: qty 2

## 2024-01-19 MED ORDER — DOCUSATE SODIUM 100 MG PO CAPS
100.0000 mg | ORAL_CAPSULE | Freq: Two times a day (BID) | ORAL | Status: DC
  Administered 2024-01-21: 100 mg via ORAL
  Filled 2024-01-19 (×3): qty 1

## 2024-01-19 MED ORDER — DEXAMETHASONE SODIUM PHOSPHATE 10 MG/ML IJ SOLN
INTRAMUSCULAR | Status: AC
  Filled 2024-01-19: qty 1

## 2024-01-19 MED ORDER — FENTANYL CITRATE (PF) 100 MCG/2ML IJ SOLN
INTRAMUSCULAR | Status: AC
  Filled 2024-01-19: qty 2

## 2024-01-19 MED ORDER — ONDANSETRON HCL 4 MG/2ML IJ SOLN
4.0000 mg | Freq: Once | INTRAMUSCULAR | Status: AC
  Administered 2024-01-19: 4 mg via INTRAVENOUS
  Filled 2024-01-19: qty 2

## 2024-01-19 MED ORDER — ROCURONIUM BROMIDE 10 MG/ML (PF) SYRINGE
PREFILLED_SYRINGE | INTRAVENOUS | Status: AC
  Filled 2024-01-19: qty 20

## 2024-01-19 MED ORDER — MENTHOL 3 MG MT LOZG: 1.0000 | LOZENGE | OROMUCOSAL | Status: DC | PRN

## 2024-01-19 MED ORDER — CEFAZOLIN SODIUM-DEXTROSE 2-3 GM-%(50ML) IV SOLR
INTRAVENOUS | Status: DC | PRN
  Administered 2024-01-19: 2 g via INTRAVENOUS

## 2024-01-19 MED ORDER — OXYCODONE HCL 5 MG/5ML PO SOLN: 5.0000 mg | Freq: Once | ORAL | Status: DC | PRN

## 2024-01-19 MED ORDER — LIDOCAINE 2% (20 MG/ML) 5 ML SYRINGE
INTRAMUSCULAR | Status: AC
  Filled 2024-01-19: qty 10

## 2024-01-19 MED ORDER — 0.9 % SODIUM CHLORIDE (POUR BTL) OPTIME
TOPICAL | Status: DC | PRN
  Administered 2024-01-19: 1000 mL

## 2024-01-19 SURGICAL SUPPLY — 42 items
BAG COUNTER SPONGE SURGICOUNT (BAG) ×1 IMPLANT
BIT DRILL CANN LG 4.3MM (BIT) IMPLANT
BNDG COHESIVE 6X5 TAN ST LF (GAUZE/BANDAGES/DRESSINGS) IMPLANT
BRUSH SCRUB EZ PLAIN DRY (MISCELLANEOUS) ×2 IMPLANT
COVER PERINEAL POST (MISCELLANEOUS) ×1 IMPLANT
COVER SURGICAL LIGHT HANDLE (MISCELLANEOUS) ×2 IMPLANT
DRAPE C-ARM 42X72 X-RAY (DRAPES) ×1 IMPLANT
DRAPE C-ARMOR (DRAPES) ×1 IMPLANT
DRAPE HALF SHEET 40X57 (DRAPES) IMPLANT
DRAPE INCISE IOBAN 66X45 STRL (DRAPES) ×1 IMPLANT
DRAPE SURG ORHT 6 SPLT 77X108 (DRAPES) IMPLANT
DRAPE U-SHAPE 47X51 STRL (DRAPES) ×1 IMPLANT
DRESSING MEPILEX FLEX 4X4 (GAUZE/BANDAGES/DRESSINGS) ×1 IMPLANT
DRSG EMULSION OIL 3X3 NADH (GAUZE/BANDAGES/DRESSINGS) ×1 IMPLANT
DRSG MEPILEX POST OP 4X8 (GAUZE/BANDAGES/DRESSINGS) ×1 IMPLANT
ELECTRODE REM PT RTRN 9FT ADLT (ELECTROSURGICAL) ×1 IMPLANT
GLOVE BIO SURGEON STRL SZ7.5 (GLOVE) ×1 IMPLANT
GLOVE BIO SURGEON STRL SZ8 (GLOVE) ×1 IMPLANT
GLOVE BIOGEL PI IND STRL 7.5 (GLOVE) ×1 IMPLANT
GLOVE BIOGEL PI IND STRL 8 (GLOVE) ×1 IMPLANT
GLOVE SURG ORTHO LTX SZ7.5 (GLOVE) ×2 IMPLANT
GOWN STRL REUS W/ TWL LRG LVL3 (GOWN DISPOSABLE) ×2 IMPLANT
GOWN STRL REUS W/ TWL XL LVL3 (GOWN DISPOSABLE) ×1 IMPLANT
GUIDEPIN VERSANAIL DSP 3.2X444 (ORTHOPEDIC DISPOSABLE SUPPLIES) IMPLANT
KIT BASIN OR (CUSTOM PROCEDURE TRAY) ×1 IMPLANT
KIT TURNOVER KIT B (KITS) ×1 IMPLANT
MANIFOLD NEPTUNE II (INSTRUMENTS) ×1 IMPLANT
NAIL HIP FRACT 130D 9X180 (Orthopedic Implant) IMPLANT
NS IRRIG 1000ML POUR BTL (IV SOLUTION) ×1 IMPLANT
PACK GENERAL/GYN (CUSTOM PROCEDURE TRAY) ×1 IMPLANT
PAD ARMBOARD POSITIONER FOAM (MISCELLANEOUS) ×2 IMPLANT
SCREW BONE CORTICAL 5.0X36 (Screw) IMPLANT
SCREW LAG HIP FRA NAIL 10.5X90 (Orthopedic Implant) IMPLANT
STAPLER SKIN PROX 35W (STAPLE) ×1 IMPLANT
STOCKINETTE IMPERVIOUS LG (DRAPES) IMPLANT
SUT ETHILON 2 0 PSLX (SUTURE) ×1 IMPLANT
SUT VIC AB 0 CT1 27XBRD ANBCTR (SUTURE) ×1 IMPLANT
SUT VIC AB 1 CT1 27XBRD ANBCTR (SUTURE) ×1 IMPLANT
SUT VIC AB 2-0 CT1 TAPERPNT 27 (SUTURE) ×1 IMPLANT
TOWEL GREEN STERILE (TOWEL DISPOSABLE) ×2 IMPLANT
TOWEL GREEN STERILE FF (TOWEL DISPOSABLE) ×1 IMPLANT
WATER STERILE IRR 1000ML POUR (IV SOLUTION) ×1 IMPLANT

## 2024-01-19 NOTE — Progress Notes (Signed)
 Patient arrived to the room via PACU nurses. Patient on 2L of oxygen, LR at gravity. Family at bedside. Patient is non responsive to sternal rub. Patient grimaced and opened his eyes briefly while being turned on side during skin assessment. His wife attempted to wake him but was unsuccessful at doing so. She stated that he speaks at baseline and has bilateral hand tremors. Patient is lethargic. GCS is 7. VS 98/62 map 74, HR 65, RR 16, 100% on 2L. On call provider notified.

## 2024-01-19 NOTE — Anesthesia Preprocedure Evaluation (Signed)
 Anesthesia Evaluation  Patient identified by MRN, date of birth, ID band Patient awake    Reviewed: Allergy & Precautions, H&P , NPO status , Patient's Chart, lab work & pertinent test results  History of Anesthesia Complications Negative for: history of anesthetic complications  Airway Mallampati: II  TM Distance: >3 FB Neck ROM: Full    Dental no notable dental hx.    Pulmonary neg pulmonary ROS, neg sleep apnea   Pulmonary exam normal breath sounds clear to auscultation       Cardiovascular hypertension, (-) angina (-) Past MI and (-) Cardiac Stents Normal cardiovascular exam Rhythm:Regular Rate:Normal     Neuro/Psych neg Seizures PSYCHIATRIC DISORDERS     Dementia negative neurological ROS     GI/Hepatic negative GI ROS, Neg liver ROS,,,  Endo/Other  negative endocrine ROS    Renal/GU negative Renal ROS  negative genitourinary   Musculoskeletal Right hip fx   Abdominal   Peds negative pediatric ROS (+)  Hematology negative hematology ROS (+)   Anesthesia Other Findings   Reproductive/Obstetrics negative OB ROS                              Anesthesia Physical Anesthesia Plan  ASA: 3  Anesthesia Plan: General   Post-op Pain Management: Ofirmev  IV (intra-op)*   Induction: Intravenous  PONV Risk Score and Plan: 1 and Ondansetron , Dexamethasone  and Treatment may vary due to age or medical condition  Airway Management Planned: Oral ETT  Additional Equipment: None  Intra-op Plan:   Post-operative Plan: Extubation in OR  Informed Consent: I have reviewed the patients History and Physical, chart, labs and discussed the procedure including the risks, benefits and alternatives for the proposed anesthesia with the patient or authorized representative who has indicated his/her understanding and acceptance.     Dental advisory given  Plan Discussed with: CRNA  Anesthesia Plan  Comments:          Anesthesia Quick Evaluation

## 2024-01-19 NOTE — ED Triage Notes (Addendum)
 Pt has been falling recently. Pt fell while trying to go up two stairs today landing on buttocks. When pt went to get up, he c/o right hip pain. Pt did not hit head and does not take blood thinners. Pt's wife states pt has not been able to walk on leg since falling.

## 2024-01-19 NOTE — ED Triage Notes (Signed)
 Patient here today with complaints of right hip pain after a fall last night. Patient landed on his butt in the floor. Denies syncope states he just lost his balance.

## 2024-01-19 NOTE — Transfer of Care (Signed)
 Immediate Anesthesia Transfer of Care Note  Patient: Andrew Lester  Procedure(s) Performed: FIXATION, FRACTURE, INTERTROCHANTERIC, WITH INTRAMEDULLARY ROD (Right)  Patient Location: PACU  Anesthesia Type:General  Level of Consciousness: awake and drowsy  Airway & Oxygen Therapy: Patient Spontanous Breathing and Patient connected to nasal cannula oxygen  Post-op Assessment: Report given to RN and Post -op Vital signs reviewed and stable  Post vital signs: Reviewed and stable  Last Vitals:  Vitals Value Taken Time  BP 141/79 01/19/24 19:30  Temp 36.7 C 01/19/24 19:25  Pulse 77 01/19/24 19:37  Resp 17 01/19/24 19:37  SpO2 98 % 01/19/24 19:37  Vitals shown include unfiled device data.  Last Pain:  Vitals:   01/19/24 1925  TempSrc:   PainSc: Asleep         Complications: No notable events documented.

## 2024-01-19 NOTE — Consult Note (Signed)
 Orthopaedic Trauma Service (OTS) Consult   Patient ID: Andrew Lester MRN: [id number] DOB/AGE: 1951/05/18 73 y.o.   Reason for Consult: Right hip fracture Referring Physician: Franky Gaul, MD (EDP)   HPI: Andrew Lester is an 73 y.o. male with baseline dementia who sustained a fall yesterday evening.  Wife states that they were in the living room when he sustained this fall.  She did not think much of it as he has been falling frequently over the last month and usually gets up and starts going again.  Decided to come to the emergency room today when he was continue to have difficulty putting weight on his right leg and having some fussiness with movement of his right leg.  Again he has dementia at baseline and has minimal communication.  Wife does state that he needs a fair amount of assistance for basic activities.  He does walk around the house a lot.  She reports that he does not use any assistive devices but rather she is usually always right next to him to provide balance and support.  They live in a single-story house together and they do also have 2 grandsons that help out quite regularly.  No other real pertinent past medical issues of note.  He is not on any active medications  Past Medical History:  Diagnosis Date   Dementia [facility])    ED (erectile dysfunction)    Gout    History of kidney stones    Hypertension    Kidney stones    Prediabetes     Past Surgical History:  Procedure Laterality Date   CHOLECYSTECTOMY N/A 10/08/2022   Procedure: LAPAROSCOPIC CHOLECYSTECTOMY;  Surgeon: Ebbie Cough, MD;  Location: [facility] OR;  Service: General;  Laterality: N/A;   COLONOSCOPY  08/10/2005    Family History  Problem Relation Age of Onset   Hypertension Mother    CAD Mother    Breast cancer Mother    Alzheimer's disease Mother    Osteoarthritis Mother    Diabetes Mother    Cancer Father        lung-smoker   Hypertension Father    Other Father        black lung     Social History:  reports that he has never smoked. He has never used smokeless tobacco. He reports that he does not currently use alcohol. He reports that he does not use drugs.  Allergies:  Allergies  Allergen Reactions   Amoxicillin Rash    Has patient had a PCN reaction causing immediate rash, facial/tongue/throat swelling, SOB or lightheadedness with hypotension: No Has patient had a PCN reaction causing severe rash involving mucus membranes or skin necrosis: No Has patient had a PCN reaction that required hospitalization No Has patient had a PCN reaction occurring within the last 10 years: No  If all of the above answers are NO, then may proceed with Cephalosporin use.    Erythromycin Rash    Medications: I have reviewed the patient's current medications. Current Outpatient Medications  Medication Instructions   Oxymetazoline HCl (SINEX LONG-ACTING NA) 1 spray, Nasal, Daily PRN   traMADol  (ULTRAM ) 50 mg, Oral, Every 6 hours PRN      Results for orders placed or performed during the hospital encounter of 01/19/24 (from the past 48 hours)  CBC     Status: None   Collection Time: 01/19/24 12:42 PM  Result Value Ref Range   WBC 8.8 4.0 - 10.5 K/uL  RBC 4.33 4.22 - 5.81 MIL/uL   Hemoglobin 13.4 13.0 - 17.0 g/dL   HCT 59.7 60.9 - 47.9 %   MCV 92.8 80.0 - 100.0 fL   MCH 30.9 26.0 - 34.0 pg   MCHC 33.3 30.0 - 36.0 g/dL   RDW 86.4 88.4 - 84.4 %   Platelets 174 150 - 400 K/uL   nRBC 0.0 0.0 - 0.2 %    Comment: Performed at [facility], [address] [place], [place] [address]  Basic metabolic panel with GFR     Status: Abnormal   Collection Time: 01/19/24 12:42 PM  Result Value Ref Range   Sodium 138 135 - 145 mmol/L   Potassium 4.0 3.5 - 5.1 mmol/L   Chloride 102 98 - 111 mmol/L   CO2 25 22 - 32 mmol/L   Glucose, Bld 148 (H) 70 - 99 mg/dL    Comment: Glucose reference range applies only to samples taken after fasting for at least 8 hours.   BUN 10 8 - 23  mg/dL   Creatinine, Ser 8.93 0.61 - 1.24 mg/dL   Calcium 9.0 8.9 - 89.6 mg/dL   GFR, Estimated >39 >39 mL/min    Comment: (NOTE) Calculated using the CKD-EPI Creatinine Equation (2021)    Anion gap 11 5 - 15    Comment: Performed at [facility], [address], [place], [place] [address]    DG Chest 1 View Result Date: 01/19/2024 CLINICAL DATA:  fall, pain EXAM: CHEST  1 VIEW COMPARISON:  Chest x-ray 09/12/2023 FINDINGS: The heart and mediastinal contours are unchanged. Atherosclerotic plaque No focal consolidation. No pulmonary edema. No pleural effusion. No pneumothorax. No acute osseous abnormality. IMPRESSION: 1. No active disease. 2.  Aortic Atherosclerosis (ICD10-I70.0). Electronically Signed   By: Morgane  Naveau M.D.   On: 01/19/2024 14:02   DG HIP UNILAT W OR W/O PELVIS 2-3 VIEWS RIGHT Result Date: 01/19/2024 EXAM: 2 or more VIEW(S) XRAY OF THE RIGHT HIP 01/19/2024 01:48:12 PM COMPARISON: None available. CLINICAL HISTORY: Fall with right hip pain. FINDINGS: BONES AND JOINTS: Acute, comminuted displaced intertrochanteric fracture of right hip with varus deformity. Degenerative changes of lower lumbar spine. SOFT TISSUES: Vascular calcifications. IMPRESSION: 1. Acute displaced intertrochanteric fracture of the right proximal femur with varus deformity. Electronically signed by: Waddell Calk MD 01/19/2024 02:01 PM EDT RP Workstation: [id number]    Intake/Output    None      Review of Systems  Unable to perform ROS: Dementia   Blood pressure 120/66, pulse 83, temperature 97.8 F (36.6 C), resp. rate 15, height 5' 5 (1.651 m), weight 65.8 kg, SpO2 98%. Physical Exam Vitals and nursing note reviewed.  Constitutional:      General: He is awake. He is not in acute distress.    Comments: About all that communicative  Cardiovascular:     Heart sounds: S1 normal and S2 normal.  Pulmonary:     Comments: Unlabored Musculoskeletal:     Cervical back: No spinous process  tenderness or muscular tenderness.     Comments:  Pelvis and right lower extremity Pelvis is without instability Right leg is shortened and externally rotated about the hip No traumatic wounds appreciated Skin around the right hip is stable, no significant ecchymosis at this time No appreciable knee effusion Lower leg ankle and foot are unremarkable with exam, no deformities, no open wounds Extremity is warm + DP pulse Patient is able to flex and extend his toes and ankle Sensory  functions appear grossly intact as well No pitting edema or DCT   Skin:    General: Skin is warm.     Capillary Refill: Capillary refill takes less than 2 seconds.  Neurological:     Mental Status: He is confused.      Assessment/Plan:  73 year old male with dementia status post fall with intertrochanteric right hip fracture   -Displaced intertrochanteric right hip fracture  Recommend intramedullary nailing of his right hip  Discussed with wife and she wishes to proceed  He will be weightbearing as tolerated postoperatively with no motion restrictions  I will see how he does with therapy but may need SNF   OR later today   Risks and benefits reviewed with wife and she wished to proceed  - Pain management:  Multimodal, minimize narcotics  Delirium precautions  - ABL anemia/Hemodynamics  Monitor  - Medical issues   Per primary  - DVT/PE prophylaxis:  SCDs and Lovenox  while inpatient.  Given frequent falls will not likely discharge on pharmacologic anticoagulation  - ID:   Perioperative antibiotics  - Metabolic Bone Disease:  Fracture indicative of osteoporosis  Check vitamin D  levels  - Activity:  Therapies postop  - FEN/GI prophylaxis/Foley/Lines:  N.p.o. for now  Advance diet postop  SLP evaluation postop given dementia  - Impediments to fracture healing:  Fragility fracture indicating osteoporosis  Dementia - Dispo:  OR later today for IM nail right hip   Francis MICAEL Mt, PA-C [phone] (C) 01/19/2024, 3:04 PM  [facility] [address] [place] [address] [phone] [phone] (F)    After 5pm and on the weekends please log on to Amion, go to orthopaedics and the look under the [facility] Call for the provider(s) on call. You can also call our office at [phone] and then follow the prompts to be connected to the call team.

## 2024-01-19 NOTE — Anesthesia Procedure Notes (Signed)
 Procedure Name: Intubation Date/Time: 01/19/2024 5:27 PM  Performed by: Draedyn Weidinger C., CRNAPre-anesthesia Checklist: Patient identified, Emergency Drugs available, Suction available, Patient being monitored and Timeout performed Patient Re-evaluated:Patient Re-evaluated prior to induction Oxygen Delivery Method: Circle system utilized Preoxygenation: Pre-oxygenation with 100% oxygen Induction Type: IV induction Ventilation: Mask ventilation without difficulty Laryngoscope Size: Mac and 4 Grade View: Grade I Tube type: Oral Tube size: 7.5 mm Number of attempts: 1 Airway Equipment and Method: Stylet Placement Confirmation: ETT inserted through vocal cords under direct vision, positive ETCO2 and breath sounds checked- equal and bilateral Secured at: 22 cm Tube secured with: Tape Dental Injury: Teeth and Oropharynx as per pre-operative assessment

## 2024-01-19 NOTE — Progress Notes (Signed)
 Patient arrived from OR to PACU bay 13 at 17. Per OR staff patient has baseline dementia and was unable to answer questions appropriately prior to surgery. Also informed that at times he would whisper incomprehensible words and had shaky repetitive hand movements that were not new if I should happen to notice them. 1930 Dr. Erma arrived at bedside to round on patient. He stated that he looked good, everything went well and informed me that prior to surgery the patient would not follow verbal commands or answer questions due to his dementia. 1955 Called report to receiving nurse Hunter, RN. Informed her that the patient was not ready to come back to unit at this time as we were waiting on radiology to perform his x-rays. 2000 Radiology at bedside, x-rays obtained and patient had very large incontinent urine episode. Patient was cleaned, gown and linens changed. Patient still will not follow commands but is guarding and grimacing. Pain medications given at 2018, see MAR. 2040 Patient resting comfortably. Maintaining airway and O2 sats using 2L O2 via nasal cannula. No distress noted. Attempted to call 5N desk to inform receiving RN that we would be on our way with patient , no answer.  Transported patient to 5N room 17 via bed with assistance of PACU RN Kaitlyn Bellino.Upon arrival to patient's room we were met by patient's wife Amy and other family members. Call bell pressed to call for assistance from floor RN for visual assessment and hand off report. Receiving RN Hunter concerned that patient would not open eyes or answer questions. I reinforced to her that per all reports I had received, even from his wife at bedside, that the patient would not always follow commands or speak. Wife Amy noted that she could tell he was listening at this time but that he has even gotten to the point at home with her where he would not follow commands or speak on command then randomly would shout out the answer much later. Wife  seemed comfortable with patient's response. We obtained vitals on patient that were acceptable. RN Misty dismissed me from room. RN Carolyn left to ask the unit Charge RN to come to the room to make sure there were no issues with patient handoff. I met Consulting civil engineer in hallway and relayed all previous information and that I wanted to have her present for second visual assessment to make sure that Misty was comfortable with handoff. When I entered the room with the Charge RN, Misty again dismissed me stating that she and the Charge nurse were going to complete the patient's skin assessment, and I could leave. Charge RN stated that all was fine and agreeable to our departure from his room at this time.

## 2024-01-19 NOTE — H&P (Signed)
 History and Physical    Patient: Andrew Lester FMW:[id number] DOB: [birth year] DOA: 01/19/2024 DOS: the patient was seen and examined on 01/19/2024 PCP: Okey Carlin Redbird, MD  Patient coming from: Home  Chief Complaint:  Chief Complaint  Patient presents with   Fall   HPI: Andrew Lester is a 73 y.o. male with medical history significant of history of dementia who presents after having a fall.  He is accompanied by his wife, who is his primary caregiver.  He experienced a fall at home when he attempted to step up into another room and lost his balance yesterday evening.  He did not hit his head during the fall. His wife, who was present at the time, reports that he showed discomfort when the area was touched or whenever the leg was moved.  He has a history of dementia, which affects his communication and orientation. His wife notes that he is often difficult to understand and requires assistance with eating. He has not eaten or drunk anything today.  He does not use any assistive devices for mobility and is generally unbalanced at baseline. His wife describes herself as his 'second layer of skin,' indicating her constant presence and assistance. He is not on any medications and does not have diabetes.    In the emergency department patient was noted to be afebrile with stable vital signs.  X-ray of the right hip revealed  an acute displaced intertrochanteric fracture of the right proximal femur with varus deformity.chest x-ray noted no acute abnormality.  Patient had been given Zofran  and morphine  IV.  Orthopedics consulted and plan on possibly taking the patient for surgical correction later on today.  Review of Systems: unable to review all systems due to the inability of the patient to answer questions. Past Medical History:  Diagnosis Date   Dementia [facility])    ED (erectile dysfunction)    Gout    History of kidney stones    Hypertension    Kidney stones    Prediabetes    Past  Surgical History:  Procedure Laterality Date   CHOLECYSTECTOMY N/A 10/08/2022   Procedure: LAPAROSCOPIC CHOLECYSTECTOMY;  Surgeon: Ebbie Cough, MD;  Location: [facility] OR;  Service: General;  Laterality: N/A;   COLONOSCOPY  08/10/2005   Social History:  reports that he has never smoked. He has never used smokeless tobacco. He reports that he does not currently use alcohol. He reports that he does not use drugs.  Allergies  Allergen Reactions   Amoxicillin Rash    Has patient had a PCN reaction causing immediate rash, facial/tongue/throat swelling, SOB or lightheadedness with hypotension: No Has patient had a PCN reaction causing severe rash involving mucus membranes or skin necrosis: No Has patient had a PCN reaction that required hospitalization No Has patient had a PCN reaction occurring within the last 10 years: No  If all of the above answers are NO, then may proceed with Cephalosporin use.    Erythromycin Rash    Family History  Problem Relation Age of Onset   Hypertension Mother    CAD Mother    Breast cancer Mother    Alzheimer's disease Mother    Osteoarthritis Mother    Diabetes Mother    Cancer Father        lung-smoker   Hypertension Father    Other Father        black lung    Prior to Admission medications   Medication Sig Start Date End Date Taking? Authorizing Provider  Oxymetazoline  HCl (SINEX LONG-ACTING NA) Place 1 spray into the nose daily as needed (congestion).    [provider]  traMADol  (ULTRAM ) 50 MG tablet Take 1 tablet (50 mg total) by mouth every 6 (six) hours as needed. 10/08/22   Ebbie Cough, MD    Physical Exam: Vitals:   01/19/24 1213 01/19/24 1213  BP:  120/66  Pulse:  83  Resp:  15  Temp:  97.8 F (36.6 C)  SpO2:  98%  Weight: 65.8 kg   Height: 5' 5 (1.651 m)    Constitutional: Elderly male currently in no acute distress Eyes: PERRL, lids and conjunctivae normal ENMT: Mucous membranes are  dry. Posterior  pharynx clear of any exudate or lesions.fair dentition Neck: normal, supple  Respiratory: clear to auscultation bilaterally, no wheezing, no crackles. Normal respiratory effort. No accessory muscle use.  Cardiovascular: Regular rate and rhythm, no murmurs / rubs / gallops. No extremity edema. 2+ pedal pulses.   Abdomen: no tenderness, no masses palpated. No hepatosplenomegaly. Bowel sounds positive.  Musculoskeletal: no clubbing / cyanosis.  Right leg externally rotated and mildly shortened.  Right Skin: no rashes, lesions, ulcers. No induration Neurologic: CN 2-12 grossly intact.   Strength 5/5 in all 4.  Psychiatric: Patient alert and appears oriented only to self  Data Reviewed:  EKG revealed normal sinus rhythm at 75 bpm.  Reviewed labs, imaging, pertinent records as documented.  Assessment and Plan:  Closed right intertrochanteric hip fracture secondary to fall Patient presents after having a mechanical fall at home.  Wife notes that he is been unsteady but does not use any assistive devices as she is normally there to help when he is ambulating.  X-rays revealed a displaced right proximal intertrochanteric femur fracture.  Orthopedics consulted and plan on surgical correction. - Admit to a MedSurg bed - Hip fracture order set utilized - N.p.o. for need of surgical procedure - Morphine /hydrocodone  p.o. as needed for moderate to severe pain respectively - Appreciate orthopedic consultative services, will follow-up for any further recommendations  Dementia Patient has pretty significant dementia for which wife notes that his speech is affected and needs assistance to complete tasks of daily living. - Delirium precautions  DVT prophylaxis: Lovenox  Advance Care Planning:   Code Status: Full Code    Consults: Orthopedics  Family Communication: Patient's wife updated at bedside  Severity of Illness: The appropriate patient status for this patient is INPATIENT. Inpatient status is  judged to be reasonable and necessary in order to provide the required intensity of service to ensure the patient's safety. [name] presenting symptoms, physical exam findings, and initial radiographic and laboratory data in the context of their chronic comorbidities is felt to place them at high risk for further clinical deterioration. Furthermore, it is not anticipated that the patient will be medically stable for discharge from the hospital within 2 midnights of admission.   * I certify that at the point of admission it is my clinical judgment that the patient will require inpatient hospital care spanning beyond 2 midnights from the point of admission due to high intensity of service, high risk for further deterioration and high frequency of surveillance required.*  Author: Maximino DELENA Sharps, MD 01/19/2024 2:25 PM  For on call review [email or web address].

## 2024-01-19 NOTE — ED Provider Notes (Signed)
 Andrew Lester AT [facility] Provider Note   CSN: [id number] Arrival date & time: 01/19/24  1205     Patient presents with: Andrew Lester   Andrew Lester is a 73 y.o. male.   Pt s/p fall at home last night. Hx dementia, recent/current mental status has remained c/w baseline. Spouse normally assists w walking to try to prevent falls. Last evening in living room, single step, fell back and onto right side. C/o right hip pain post fall. Has not tried to ambulate since. No loc. Pt limited historian - level 5 caveat. No other reported injury or new pain. No recent acute illness or other new symptoms.   The history is provided by the patient, the spouse and medical records. The history is limited by the condition of the patient.  Fall       Prior to Admission medications   Medication Sig Start Date End Date Taking? Authorizing Provider  Oxymetazoline HCl (SINEX LONG-ACTING NA) Place 1 spray into the nose daily as needed (congestion).    [provider]  traMADol  (ULTRAM ) 50 MG tablet Take 1 tablet (50 mg total) by mouth every 6 (six) hours as needed. 10/08/22   Ebbie Cough, MD    Allergies: Amoxicillin and Erythromycin    Review of Systems  Unable to perform ROS: Dementia  Constitutional:  Negative for fever.  Musculoskeletal:        Right hip pain    Updated Vital Signs BP 120/66   Pulse 83   Temp 97.8 F (36.6 C)   Resp 15   Ht 1.651 m (5' 5)   Wt 65.8 kg   SpO2 98%   BMI 24.13 kg/m   Physical Exam Vitals and nursing note reviewed.  Constitutional:      Appearance: Normal appearance. He is well-developed.  HENT:     Head: Atraumatic.     Comments: No face or scalp/head bruising or contusion.     Nose: Nose normal.     Mouth/Throat:     Mouth: Mucous membranes are moist.     Pharynx: Oropharynx is clear.  Eyes:     General: No scleral icterus.    Conjunctiva/sclera: Conjunctivae normal.     Pupils: Pupils are equal, round,  and reactive to light.  Neck:     Trachea: No tracheal deviation.  Cardiovascular:     Rate and Rhythm: Normal rate and regular rhythm.     Pulses: Normal pulses.     Heart sounds: Normal heart sounds. No murmur heard.    No friction rub. No gallop.  Pulmonary:     Effort: Pulmonary effort is normal. No accessory muscle usage or respiratory distress.     Breath sounds: Normal breath sounds.  Chest:     Chest wall: No tenderness.  Abdominal:     General: Bowel sounds are normal. There is no distension.     Palpations: Abdomen is soft.     Tenderness: There is no abdominal tenderness. There is no guarding.     Comments: No abdominal bruising or contusion  Musculoskeletal:        General: No swelling.     Cervical back: Normal range of motion and neck supple. No rigidity or tenderness.     Comments: CTLS spine, non tender, aligned, no step off. Tenderness right hip, pain w rom. RLE is of normal color and warmth, with intact distal pulses, no gross swelling noted. Otherwise good rom bil extremities without other pain or focal  bony tenderness.   Skin:    General: Skin is warm and dry.     Findings: No rash.  Neurological:     Mental Status: He is alert.     Comments: Awake and alert. Mental status reported as c/w baseline.   Psychiatric:        Mood and Affect: Mood normal.     (all labs ordered are listed, but only abnormal results are displayed) Results for orders placed or performed during the hospital encounter of 01/19/24  CBC   Collection Time: 01/19/24 12:42 PM  Result Value Ref Range   WBC 8.8 4.0 - 10.5 K/uL   RBC 4.33 4.22 - 5.81 MIL/uL   Hemoglobin 13.4 13.0 - 17.0 g/dL   HCT 59.7 60.9 - 47.9 %   MCV 92.8 80.0 - 100.0 fL   MCH 30.9 26.0 - 34.0 pg   MCHC 33.3 30.0 - 36.0 g/dL   RDW 86.4 88.4 - 84.4 %   Platelets 174 150 - 400 K/uL   nRBC 0.0 0.0 - 0.2 %  Basic metabolic panel with GFR   Collection Time: 01/19/24 12:42 PM  Result Value Ref Range   Sodium 138  135 - 145 mmol/L   Potassium 4.0 3.5 - 5.1 mmol/L   Chloride 102 98 - 111 mmol/L   CO2 25 22 - 32 mmol/L   Glucose, Bld 148 (H) 70 - 99 mg/dL   BUN 10 8 - 23 mg/dL   Creatinine, Ser 8.93 0.61 - 1.24 mg/dL   Calcium 9.0 8.9 - 89.6 mg/dL   GFR, Estimated >39 >39 mL/min   Anion gap 11 5 - 15      EKG: EKG Interpretation Date/Time:  Sunday January 19 2024 14:19:39 EDT Ventricular Rate:  75 PR Interval:  165 QRS Duration:  97 QT Interval:  378 QTC Calculation: 423 R Axis:   65  Text Interpretation: Sinus rhythm Confirmed by Bernard Drivers ([id number]) on 01/19/2024 2:28:44 PM  Radiology: DG Chest 1 View Result Date: 01/19/2024 CLINICAL DATA:  fall, pain EXAM: CHEST  1 VIEW COMPARISON:  Chest x-ray 09/12/2023 FINDINGS: The heart and mediastinal contours are unchanged. Atherosclerotic plaque No focal consolidation. No pulmonary edema. No pleural effusion. No pneumothorax. No acute osseous abnormality. IMPRESSION: 1. No active disease. 2.  Aortic Atherosclerosis (ICD10-I70.0). Electronically Signed   By: Morgane  Naveau M.D.   On: 01/19/2024 14:02   DG HIP UNILAT W OR W/O PELVIS 2-3 VIEWS RIGHT Result Date: 01/19/2024 EXAM: 2 or more VIEW(S) XRAY OF THE RIGHT HIP 01/19/2024 01:48:12 PM COMPARISON: None available. CLINICAL HISTORY: Fall with right hip pain. FINDINGS: BONES AND JOINTS: Acute, comminuted displaced intertrochanteric fracture of right hip with varus deformity. Degenerative changes of lower lumbar spine. SOFT TISSUES: Vascular calcifications. IMPRESSION: 1. Acute displaced intertrochanteric fracture of the right proximal femur with varus deformity. Electronically signed by: Waddell Calk MD 01/19/2024 02:01 PM EDT RP Workstation: [id number]     Procedures   Medications Ordered in the ED  morphine  (PF) 2 MG/ML injection 2 mg (2 mg Intravenous Given 01/19/24 1252)  ondansetron  (ZOFRAN ) injection 4 mg (4 mg Intravenous Given 01/19/24 1252)                                    Medical  Decision Making Problems Addressed: Closed displaced intertrochanteric fracture of right femur, initial encounter [facility]): acute illness or injury with systemic symptoms that poses a threat  to life or bodily functions Dementia without behavioral disturbance (HCC): chronic illness or injury with exacerbation, progression, or side effects of treatment that poses a threat to life or bodily functions Fall from slip, trip, or stumble, initial encounter: acute illness or injury with systemic symptoms that poses a threat to life or bodily functions  Amount and/or Complexity of Data Reviewed Independent Historian:     Details: Family, hx External Data Reviewed: notes. Labs: ordered. Decision-making details documented in ED Course. Radiology: ordered and independent interpretation performed. Decision-making details documented in ED Course. ECG/medicine tests: ordered and independent interpretation performed. Decision-making details documented in ED Course. Discussion of management or test interpretation with external provider(s): Ortho, medicine  Risk Prescription drug management. Parenteral controlled substances. Decision regarding hospitalization.   Iv ns. Continuous pulse ox and cardiac monitoring. Labs ordered/sent. Imaging ordered.   Differential diagnosis includes hip fx, contusion, etc. Dispo decision including potential need for admission considered - will get labs and imaging and reassess.   Reviewed nursing notes and prior charts for additional history. External reports reviewed. Additional history from: family.   Morphine  iv.   Cardiac monitor: sinus rhythm, rate 80.  Labs reviewed/interpreted by me - wbc and hgb normal.   Xrays reviewed/interpreted by me - +right hip fx.   Orthopedics consulted for hip fx. Discussed pt with Dr Celena - pts family indicates he has been npo since last night. Dr Celena indicates will try to do surgery today to fix hip - will keep npo.  Hospitalists  consulted for admission.  Recheck, pain controlled/relieve. Pt content nad.         Final diagnoses:  Fall from slip, trip, or stumble, initial encounter  Closed displaced intertrochanteric fracture of right femur, initial encounter [facility])  Dementia without behavioral disturbance [facility])    ED Discharge Orders     None          Bernard Drivers, MD 01/19/24 1429

## 2024-01-19 NOTE — Op Note (Signed)
 PATIENT:  Andrew Lester  17-Oct-1950 male   MEDICAL RECORD NUMBER: [id number]  PRE-OPERATIVE DIAGNOSIS:  RIGHT INTERTROCHANTERIC HIP FRACTURE  POST-OPERATIVE DIAGNOSIS:  RIGHT INTERTROCHANTERIC HIP FRACTURE  PROCEDURE:  INTRAMEDULLARY NAILING OF THE RIGHT HIP using a Biomet Affixus nail 9 mm diameter locked short.  SURGEON:  Ozell DEL. Celena, M.D.  ASSISTANT:  [facility]  ANESTHESIA:  General.  COMPLICATIONS:  None.  ESTIMATED BLOOD LOSS:  Less than 150 mL.  DISPOSITION:  To PACU.  CONDITION:  Stable.  DELAY START OF DVT PROPHYLAXIS BECAUSE OF BLEEDING RISK: NO  BRIEF SUMMARY AND INDICATION OF PROCEDURE:  Andrew Lester is a 73 y.o. year- old with multiple medical problems.  I discussed with the patient and family risks and benefits of surgical treatment including the potential for malunion, nonunion, symptomatic hardware, heart attack, stroke, neurovascular injury, bleeding, and others.  After acknowledgement of these risks, consent was provided to proceed.  BRIEF SUMMARY OF PROCEDURE:  The patient was taken to the operating room where general anesthesia was induced.  He was positioned supine on the Hana fracture table.  A closed reduction maneuver was performed of the fractured proximal femur and this was confirmed on both AP and lateral xray views. A thorough scrub and wash with chlorhexidine  and then Betadine scrub and paint was performed.  After sterile drapes and time-out, a long instrument was used to identify the appropriate starting position under C-arm on both AP and lateral images.  A 3 cm incision was made proximal to the greater trochanter.  The curved cannulated awl was inserted just medial to the tip of the lateral trochanter and then the starting guidewire advanced into the proximal femur.  This was checked on AP and lateral views.  The starting reamer was engaged with the soft tissue protected by a sleeve.  The 9 mm short nail was then inserted to the appropriate  depth.  Despite considerable effort to maintain reduction the two part fracture invariably gapped with nail insertion. Consequently I had to extend the lag screw incision proximally and use the clamp to hold the medial reduction while the nail and then the guidewire for the lag screw was inserted with the appropriate anteversion to make sure it was in a center-center position.  This was measured and the lag screw placed with excellent purchase and position checked on both views. The set screw was then engaged within the groove of the lag screw, which was allowed to telescope.  Traction was released and compression achieved with the Compression device over the lag screw.  This was followed by placement of one distal locking screw using the jig.  This was confirmed on AP and lateral images. Wounds were irrigated thoroughly, closed in a standard layered fashion. Sterile gently compressive dressings were applied.  Andrew Lester RNFA assisted throughout.  The patient was awakened from anesthesia and transported to the PACU in stable condition.  PROGNOSIS:  The patient will be weightbearing as tolerated with physical therapy beginning DVT prophylaxis now but likely on a shorter term basis given his dementia.  He has no range of motion precautions.  We will continue to follow while in the hospital.  Anticipate follow up in the office in 2 weeks for removal of sutures and further evaluation.     Ozell DEL. Celena, M.D.

## 2024-01-20 DIAGNOSIS — S72141A Displaced intertrochanteric fracture of right femur, initial encounter for closed fracture: Secondary | ICD-10-CM | POA: Diagnosis not present

## 2024-01-20 LAB — CBC
HCT: 34.9 % — ABNORMAL LOW (ref 39.0–52.0)
Hemoglobin: 11.5 g/dL — ABNORMAL LOW (ref 13.0–17.0)
MCH: 30.7 pg (ref 26.0–34.0)
MCHC: 33 g/dL (ref 30.0–36.0)
MCV: 93.1 fL (ref 80.0–100.0)
Platelets: 147 K/uL — ABNORMAL LOW (ref 150–400)
RBC: 3.75 MIL/uL — ABNORMAL LOW (ref 4.22–5.81)
RDW: 13.6 % (ref 11.5–15.5)
WBC: 9.7 K/uL (ref 4.0–10.5)
nRBC: 0 % (ref 0.0–0.2)

## 2024-01-20 LAB — BASIC METABOLIC PANEL WITH GFR
Anion gap: 13 (ref 5–15)
BUN: 11 mg/dL (ref 8–23)
CO2: 24 mmol/L (ref 22–32)
Calcium: 8.3 mg/dL — ABNORMAL LOW (ref 8.9–10.3)
Chloride: 103 mmol/L (ref 98–111)
Creatinine, Ser: 1.02 mg/dL (ref 0.61–1.24)
GFR, Estimated: >60 mL/min (ref >60)
Glucose, Bld: 142 mg/dL — ABNORMAL HIGH (ref 70–99)
Potassium: 4.1 mmol/L (ref 3.5–5.1)
Sodium: 140 mmol/L (ref 135–145)

## 2024-01-20 LAB — VITAMIN D 25 HYDROXY (VIT D DEFICIENCY, FRACTURES): Vit D, 25-Hydroxy: 12.51 ng/mL — ABNORMAL LOW (ref 30–100)

## 2024-01-20 MED ORDER — ENSURE PLUS HIGH PROTEIN PO LIQD
237.0000 mL | Freq: Two times a day (BID) | ORAL | Status: DC
  Administered 2024-01-20: 237 mL via ORAL

## 2024-01-20 NOTE — Anesthesia Postprocedure Evaluation (Signed)
 Anesthesia Post Note  Patient: Andrew Lester  Procedure(s) Performed: FIXATION, FRACTURE, INTERTROCHANTERIC, WITH INTRAMEDULLARY ROD (Right)     Patient location during evaluation: PACU Anesthesia Type: General Level of consciousness: awake Pain management: pain level controlled Vital Signs Assessment: post-procedure vital signs reviewed and stable Respiratory status: spontaneous breathing, nonlabored ventilation, respiratory function stable and patient connected to nasal cannula oxygen Cardiovascular status: blood pressure returned to baseline and stable Postop Assessment: no apparent nausea or vomiting Anesthetic complications: no   No notable events documented.  Last Vitals:  Vitals:   01/19/24 2300 01/19/24 2327  BP: 121/68 (!) 97/56  Pulse: 64 61  Resp: 15 13  Temp: 37 C 36.7 C  SpO2: 99% 100%    Last Pain:  Vitals:   01/19/24 2327  TempSrc: Oral  PainSc:                  Thom JONELLE Peoples

## 2024-01-20 NOTE — Significant Event (Signed)
 Rapid Response Event Note   Reason for Call :  2nd set of eyes for decreased responsiveness.  Pt is s/p IM nailing of R hip. Pt arrived to 5N at 2115 from PACU.   Initial Focused Assessment:  Pt lying in bed with eye closed. His hands are together and lying across his abdomen. He moves spontaneously but will not open his eyes to voice or pain. He will not follow commands. He will grip with both hands spontaneously but not to command. He will grimace his face to pain. Pupils are 2, equal, reactive. He does mumbles incoherently. Lungs clear/diminished. Skin warm/dry.  Wife at bedside says he is sleepy but acting as he normally does at home.   T-98.1, HR-61, BP-97/56, RR-14, SpO2-100% on 2L [place].   Interventions:  No RRT interventions needed at this time.   Plan of Care:  Pt VSS. He is protecting airway. Allow more time for anesthesia to wear off. No need for narcan at this time. Please call RRT if further assistance needed.  Event Summary:   MD Notified: Toribio, NP Call [phone] Arrival Time:0000 End Time:0015  Tish Graeme Piety, RN

## 2024-01-20 NOTE — TOC Initial Note (Addendum)
 Transition of Care [facility]) - Initial/Assessment Note    Patient Details  Name: Andrew Lester MRN: [id number] Date of Birth: 27-Nov-1950  Transition of Care [facility]) CM/SW Contact:    Rosalva Jon Bloch, RN Phone Number: 01/20/2024, 10:52 AM  Clinical Narrative:       S/p a fall, suffered closed right intertrochanteric hip fracture. Underwent INTRAMEDULLARY NAILING OF THE RIGHT HIP, 8/31.                From home with wife, primary caregiver. Pt with hx of dementia. Pt oriented to self only. Wife declining therapist recommendation for SNF placement. Agreeable to home health services. Wife without provider preference. Referral made with [facility], acceptance pending. Home health orders will be needed from provider for home health services. DME need noted for RW. Order placed. Referral will be made closer to d/c day.  Pt without RX med concerns or transportation issues.  IPCM following and will continue assisting with needs....  Expected Discharge Plan: Home w Home Health Services (wife declined SNF placement) Barriers to Discharge: No Barriers Identified   Patient Goals and CMS Choice     Choice offered to / list presented to : Spouse      Expected Discharge Plan and Services   Discharge Planning Services: CM Consult                                          Prior Living Arrangements/Services   Lives with:: Spouse, Adult Children Patient language and need for interpreter reviewed:: Yes Do you feel safe going back to the place where you live?: Yes      Need for Family Participation in Patient Care: Yes (Comment) Care giver support system in place?: Yes (comment)   Criminal Activity/Legal Involvement Pertinent to Current Situation/Hospitalization: No - Comment as needed  Activities of Daily Living   ADL Screening (condition at time of admission) Independently performs ADLs?: No Is the patient deaf or have difficulty hearing?: No Does the patient have  difficulty seeing, even when wearing glasses/contacts?: No Does the patient have difficulty concentrating, remembering, or making decisions?: Yes  Permission Sought/Granted                  Emotional Assessment       Orientation: : Oriented to Self (history of dementia) Alcohol / Substance Use: Not Applicable Psych Involvement: No (comment)  Admission diagnosis:  Dementia without behavioral disturbance (HCC) [F03.90] Fall from slip, trip, or stumble, initial encounter [W01.0XXA] Closed displaced intertrochanteric fracture of right femur, initial encounter (HCC) [S72.141A] Closed comminuted intertrochanteric fracture of proximal femur, right, initial encounter [facility]) [S72.141A] Patient Active Problem List   Diagnosis Date Noted   Closed comminuted intertrochanteric fracture of proximal femur, right, initial encounter (HCC) 01/19/2024   Fall at home, initial encounter 01/19/2024   Dementia without behavioral disturbance (HCC) 07/09/2018   Palpitations 05/01/2018   PCP:  Okey Carlin Redbird, MD Pharmacy:   [id number] - [place], [facility] - [address] [place] [address] Phone: [phone] Fax: [phone]  [facility] [id number] [place], [place] - [address] [place] [address] Phone: [phone] Fax: [phone]     Social Drivers of Health (SDOH) Social History: SDOH Screenings   Social Connections: Unknown (10/03/2021)   Received from [facility]  Tobacco Use: Low Risk  (01/19/2024)  SDOH Interventions:     Readmission Risk Interventions     No data to display

## 2024-01-20 NOTE — Progress Notes (Signed)
 PROGRESS NOTE  Andrew Lester FMW:[id number] DOB: July 10, 1950 DOA: 01/19/2024 PCP: Okey Carlin Redbird, MD   LOS: 1 day   Brief Narrative / Interim history: 73 year old male with dementia, BPH who comes into the hospital after a fall.  He was found to have a hip fracture and repaired on 8/31  Subjective / 24h Interval events: He is doing well this morning, has underlying confusion but appears calm, pleasant.  Wife is at bedside.  Assesement and Plan: Principal problem Closed right intertrochanteric hip fracture -patient was admitted to the hospital after a fall, found to have a hip fracture.  He was taken to the OR by Dr. Celena on 8/31, status post IM nailing of the right hip - PT/OT, pain control, watch post operatively  Active problems Dementia-supportive care, delirium precautions  Disposition-PT may recommend SNF however wife feels like she has excellent family support, she prefers patient to go home  Scheduled Meds:  docusate sodium   100 mg Oral BID   enoxaparin  (LOVENOX ) injection  40 mg Subcutaneous Q24H   feeding supplement  237 mL Oral BID BM   Continuous Infusions:  sodium chloride  75 mL/hr at 01/19/24 2126   PRN Meds:.HYDROcodone -acetaminophen , menthol -cetylpyridinium **OR** phenol, metoCLOPramide  **OR** metoCLOPramide  (REGLAN ) injection, morphine  injection, ondansetron  **OR** ondansetron  (ZOFRAN ) IV  Current Outpatient Medications  Medication Instructions   Oxymetazoline HCl (SINEX LONG-ACTING NA) 1 spray, Daily PRN   tamsulosin  (FLOMAX ) 0.4 mg, Daily   traMADol  (ULTRAM ) 50 mg, Oral, Every 6 hours PRN    Diet Orders (From admission, onward)     Start     Ordered   01/19/24 2134  Diet regular Room service appropriate? Yes; Fluid consistency: Thin  Diet effective now       Question Answer Comment  Room service appropriate? Yes   Fluid consistency: Thin      01/19/24 2133            DVT prophylaxis: enoxaparin  (LOVENOX ) injection 40 mg Start: 01/19/24  2200 SCDs Start: 01/19/24 2134   Lab Results  Component Value Date   PLT 174 01/19/2024      Code Status: Full Code  Family Communication: wife at bedside   Status is: Inpatient Remains inpatient appropriate because: Severity of illness   Level of care: Med-Surg  Consultants:  Orthopedic surgery   Objective: Vitals:   01/20/24 0200 01/20/24 0300 01/20/24 0449 01/20/24 0801  BP: 113/78 98/61 118/69 112/62  Pulse: 68 60 61 73  Resp: 14 15 15 18   Temp: 98 F (36.7 C) 97.9 F (36.6 C) 98.2 F (36.8 C) 98 F (36.7 C)  TempSrc: Axillary Oral Axillary Oral  SpO2: 100% 100% 99% 99%  Weight:      Height:        Intake/Output Summary (Last 24 hours) at 01/20/2024 1012 Last data filed at 01/20/2024 0100 Gross per 24 hour  Intake 1100 ml  Output 325 ml  Net 775 ml   Wt Readings from Last 3 Encounters:  01/19/24 65.8 kg  10/08/22 68 kg  10/01/22 67.2 kg    Examination:  Constitutional: NAD Eyes: no scleral icterus ENMT: Mucous membranes are moist.  Neck: normal, supple Respiratory: clear to auscultation bilaterally, no wheezing, no crackles. Cardiovascular: Regular rate and rhythm, no murmurs / rubs / gallops. No LE edema.  Abdomen: non distended, no tenderness. Bowel sounds positive.  Musculoskeletal: no clubbing / cyanosis.    Data Reviewed: I have independently reviewed following labs and imaging studies   CBC Recent Labs  Lab  01/19/24 1242  WBC 8.8  HGB 13.4  HCT 40.2  PLT 174  MCV 92.8  MCH 30.9  MCHC 33.3  RDW 13.5    Recent Labs  Lab 01/19/24 1242  NA 138  K 4.0  CL 102  CO2 25  GLUCOSE 148*  BUN 10  CREATININE 1.06  CALCIUM 9.0    ------------------------------------------------------------------------------------------------------------------ No results for input(s): CHOL, HDL, LDLCALC, TRIG, CHOLHDL, LDLDIRECT in the last 72 hours.  No results found for:  HGBA1C ------------------------------------------------------------------------------------------------------------------ No results for input(s): TSH, T4TOTAL, T3FREE, THYROIDAB in the last 72 hours.  Invalid input(s): FREET3  Cardiac Enzymes No results for input(s): CKMB, TROPONINI, MYOGLOBIN in the last 168 hours.  Invalid input(s): CK ------------------------------------------------------------------------------------------------------------------ No results found for: BNP  CBG: Recent Labs  Lab 01/19/24 2149  GLUCAP 138*    No results found for this or any previous visit (from the past 240 hours).   Radiology Studies: DG HIP UNILAT WITH PELVIS 2-3 VIEWS RIGHT Result Date: 01/19/2024 CLINICAL DATA:  Fracture, postop. EXAM: DG HIP (WITH OR WITHOUT PELVIS) 2-3V RIGHT COMPARISON:  Preoperative imaging FINDINGS: Femoral intramedullary nail with trans trochanteric and distal locking screw fixation traverse proximal femur fracture. Persistent displacement of a lesser trochanteric fragment. Recent postsurgical change includes air and edema in the soft tissues. IMPRESSION: ORIF of proximal femur fracture. No immediate postoperative complication. Electronically Signed   By: Andrea Gasman M.D.   On: 01/19/2024 22:37   DG HIP UNILAT WITH PELVIS 2-3 VIEWS RIGHT Result Date: 01/19/2024 CLINICAL DATA:  Elective surgery. EXAM: DG HIP (WITH OR WITHOUT PELVIS) 2-3V RIGHT COMPARISON:  Preoperative imaging FINDINGS: Fluoroscopic spot views of the right hip submitted from the operating room. Femoral intramedullary nail with trans trochanteric and distal locking screw fixation traverse proximal femur fracture. Fluoroscopy time 1 minutes 47 seconds. Dose 20.93 mGy. IMPRESSION: Intraoperative fluoroscopy during right proximal femur fracture ORIF. Electronically Signed   By: Andrea Gasman M.D.   On: 01/19/2024 19:35   DG C-Arm 1-60 Min-No Report Result Date:  01/19/2024 Fluoroscopy was utilized by the requesting physician.  No radiographic interpretation.   DG C-Arm 1-60 Min-No Report Result Date: 01/19/2024 Fluoroscopy was utilized by the requesting physician.  No radiographic interpretation.   DG Knee 1-2 Views Right Result Date: 01/19/2024 EXAM: 1 or 2 VIEW(S) XRAY OF THE RIGHT KNEE 01/19/2024 03:38:00 PM COMPARISON: None available. CLINICAL HISTORY: Closed comminuted intertrochanteric fracture of proximal end of right femur. FINDINGS: BONES AND JOINTS: No acute fracture. No focal osseous lesion. No joint dislocation. Moderate joint space narrowing and osteophyte formation within the lateral and patellofemoral compartments. SOFT TISSUES: Vascular calcifications. IMPRESSION: 1. No acute findings. 2. Moderate joint space narrowing and osteophyte formation within the lateral and patellofemoral compartments. Electronically signed by: Lonni Necessary MD 01/19/2024 04:00 PM EDT RP Workstation: [id number]   DG Chest 1 View Result Date: 01/19/2024 CLINICAL DATA:  fall, pain EXAM: CHEST  1 VIEW COMPARISON:  Chest x-ray 09/12/2023 FINDINGS: The heart and mediastinal contours are unchanged. Atherosclerotic plaque No focal consolidation. No pulmonary edema. No pleural effusion. No pneumothorax. No acute osseous abnormality. IMPRESSION: 1. No active disease. 2.  Aortic Atherosclerosis (ICD10-I70.0). Electronically Signed   By: Morgane  Naveau M.D.   On: 01/19/2024 14:02   DG HIP UNILAT W OR W/O PELVIS 2-3 VIEWS RIGHT Result Date: 01/19/2024 EXAM: 2 or more VIEW(S) XRAY OF THE RIGHT HIP 01/19/2024 01:48:12 PM COMPARISON: None available. CLINICAL HISTORY: Fall with right hip pain. FINDINGS: BONES AND JOINTS: Acute, comminuted displaced  intertrochanteric fracture of right hip with varus deformity. Degenerative changes of lower lumbar spine. SOFT TISSUES: Vascular calcifications. IMPRESSION: 1. Acute displaced intertrochanteric fracture of the right proximal femur with  varus deformity. Electronically signed by: Waddell Calk MD 01/19/2024 02:01 PM EDT RP Workstation: [id number]     Nilda Fendt, MD, PhD [facility]  Between 7 am - 7 pm I am available, please contact me via Amion (for emergencies) or Securechat (non urgent messages)  Between 7 pm - 7 am I am not available, please contact night coverage MD/APP via Amion

## 2024-01-20 NOTE — Progress Notes (Signed)
 Transition of Care [facility]) - CAGE-AID Screening   Patient Details  Name: Andrew Lester MRN: [id number] Date of Birth: [birth year]  Transition of Care [facility]) CM/SW Contact:    Sallyanne MALVA Mettle, RN Phone Number: 01/20/2024, 1:43 PM   Clinical Narrative: Hx dementia, unable to participate in screening.   CAGE-AID Screening: Substance Abuse Screening unable to be completed due to: : Patient unable to participate (dementia)

## 2024-01-20 NOTE — Evaluation (Signed)
 Occupational Therapy Evaluation Patient Details Name: Andrew Lester MRN: [id number] DOB: 1950-09-14 Today's Date: 01/20/2024   History of Present Illness   Patient is a 73 yo male presenting to the ED after fall on 01/19/24. R hip fracture noted, with R IM nail completed on 8/31. PMH: dementia, Gout, HTN.     Clinical Impressions Prior to this admission, patient living with his spouse and two grandsons. Patient's wife endorses that she helps him navigate around the house and redirect him, but is usually able to ambulate independently and manage ADLs once guided. Currently, patient with intermittent closing of his eyes, no command following, and need for heavy assist of 2 to complete stand pivot transfer to the recliner. Patient total A for all other aspects of care. OT speaking with wife about recommendations, explaining the current level of care that would be needed, with the recommendation made for therapy services < 3 hours prior to discharge home. If family wants to take patient home, patient would require a RW, a hospital bed, and BSC due to current level. OT will continue to follow acutely.     If plan is discharge home, recommend the following:   Two people to help with walking and/or transfers;Two people to help with bathing/dressing/bathroom;Assistance with cooking/housework;Assistance with feeding;Direct supervision/assist for medications management;Direct supervision/assist for financial management;Assist for transportation;Help with stairs or ramp for entrance;Supervision due to cognitive status     Functional Status Assessment   Patient has had a recent decline in their functional status and demonstrates the ability to make significant improvements in function in a reasonable and predictable amount of time.     Equipment Recommendations   BSC/3in1;Hospital bed;Other (comment) (RW)     Recommendations for Other Services         Precautions/Restrictions    Precautions Precautions: Fall Recall of Precautions/Restrictions: Impaired Restrictions Weight Bearing Restrictions Per Provider Order: Yes RLE Weight Bearing Per Provider Order: Weight bearing as tolerated     Mobility Bed Mobility Overal bed mobility: Needs Assistance Bed Mobility: Rolling, Sidelying to Sit Rolling: Max assist, +2 for physical assistance Sidelying to sit: Max assist, +2 for physical assistance       General bed mobility comments: Pt not following commands for bed mobility, reaching for rail, or opening eyes. Opted for log roll to achieve EOB. Once sitting, pt smiling and conversant, however wanting to keep eyes closed.    Transfers Overall transfer level: Needs assistance Equipment used: 2 person hand held assist Transfers: Sit to/from Stand, Bed to chair/wheelchair/BSC Sit to Stand: Max assist, +2 physical assistance, From elevated surface Stand pivot transfers: Max assist, +2 physical assistance, From elevated surface         General transfer comment: heavy +2 max assist to power up to full stand. Pt able to tolerate 1 stand, and 1 SPT to chair. Pt not advancing LE's towards chair and was a true pivot.      Balance Overall balance assessment: Needs assistance Sitting-balance support: Feet supported, Bilateral upper extremity supported Sitting balance-Leahy Scale: Poor Sitting balance - Comments: Hands on guarding provided almost constantly   Standing balance support: Bilateral upper extremity supported Standing balance-Leahy Scale: Zero Standing balance comment: +2 assist required                           ADL either performed or assessed with clinical judgement   ADL Overall ADL's : Needs assistance/impaired Eating/Feeding: Total assistance;Sitting   Grooming: Total assistance;Sitting  Upper Body Bathing: Total assistance;Sitting   Lower Body Bathing: Total assistance;Sitting/lateral leans;Sit to/from stand   Upper Body Dressing  : Total assistance;Sitting   Lower Body Dressing: Total assistance;Sitting/lateral leans;Sit to/from stand   Toilet Transfer: Maximal assistance;+2 for physical assistance;+2 for safety/equipment;Stand-pivot   Toileting- Clothing Manipulation and Hygiene: Total assistance;Bed level       Functional mobility during ADLs: Total assistance;+2 for safety/equipment;+2 for physical assistance;Cueing for safety;Cueing for sequencing;Rolling walker (2 wheels) General ADL Comments: Prior to this admission, patient living with his spouse and two grandsons. Patient's wife endorses that she helps him navigate around the house and redirect him, but is usually able to ambulate independently and manage ADLs once guided. Currently, patient with intermittent closing of his eyes, no command following, and need for heavy assist of 2 to complete stand pivot transfer to the recliner. Patient total A for all other aspects of care. OT speaking with wife about recommendations, explaining the current level of care that would be needed, with the recommendation made for therapy services < 3 hours prior to discharge home. If family wants to take patient home, patient would require a RW, a hospital bed, and BSC due to current level. OT will continue to follow acutely.     Vision Baseline Vision/History: 0 No visual deficits Ability to See in Adequate Light: 0 Adequate Patient Visual Report: No change from baseline       Perception Perception: Not tested       Praxis Praxis: Not tested       Pertinent Vitals/Pain Pain Assessment Pain Assessment: Faces Faces Pain Scale: Hurts even more Pain Location: R hip during mobility Pain Descriptors / Indicators: Operative site guarding, Sharp, Moaning, Grimacing Pain Intervention(s): Limited activity within patient's tolerance, Monitored during session, Repositioned     Extremity/Trunk Assessment Upper Extremity Assessment Upper Extremity Assessment: Right hand  dominant;Difficult to assess due to impaired cognition;Overall [facility] for tasks assessed   Lower Extremity Assessment Lower Extremity Assessment: Defer to PT evaluation   Cervical / Trunk Assessment Cervical / Trunk Assessment: Other exceptions Cervical / Trunk Exceptions: Forward head posture with rounded shoulders   Communication Communication Communication: No apparent difficulties   Cognition Arousal: Alert Behavior During Therapy: WFL for tasks assessed/performed Cognition: Cognition impaired, History of cognitive impairments             OT - Cognition Comments: Dementia at baseline, followed less than 25% of commands                 Following commands: Impaired Following commands impaired: Follows one step commands inconsistently     Cueing  General Comments   Cueing Techniques: Verbal cues;Gestural cues  VSS on RA   Exercises     Shoulder Instructions      Home Living Family/patient expects to be discharged to:: Private residence Living Arrangements: Spouse/significant other;Other relatives (Grandsons) Available Help at Discharge: Family;Available 24 hours/day Type of Home: House Home Access: Level entry (threshold step)     Home Layout: One level     Bathroom Shower/Tub: Chief Strategy Officer: Standard     Home Equipment: None          Prior Functioning/Environment Prior Level of Function : Needs assist;History of Falls (last six months)  Cognitive Assist : Mobility (cognitive);ADLs (cognitive) Mobility (Cognitive): Step by step cues ADLs (Cognitive): Step by step cues Physical Assist : ADLs (physical)   ADLs (physical): Grooming;Dressing;Bathing;Toileting;IADLs;Feeding Mobility Comments: walked without AD, wife endorses nearly one fall a  week ADLs Comments: needed assist for all ADLs, progressively more forgetful    OT Problem List: Decreased strength;Impaired balance (sitting and/or standing);Decreased activity  tolerance;Decreased range of motion;Decreased coordination;Decreased cognition;Decreased knowledge of use of DME or AE;Decreased safety awareness;Pain   OT Treatment/Interventions: Self-care/ADL training;Therapeutic exercise;Energy conservation;DME and/or AE instruction;Manual therapy;Therapeutic activities;Patient/family education;Balance training;Cognitive remediation/compensation      OT Goals(Current goals can be found in the care plan section)   Acute Rehab OT Goals Patient Stated Goal: unable OT Goal Formulation: Patient unable to participate in goal setting Time For Goal Achievement: 02/03/24 Potential to Achieve Goals: Fair ADL Goals Pt Will Perform Eating: with min assist;sitting Pt Will Perform Grooming: with min assist;sitting Pt Will Perform Lower Body Bathing: with mod assist;sitting/lateral leans;sit to/from stand Pt Will Perform Lower Body Dressing: with mod assist;sitting/lateral leans;sit to/from stand Pt Will Transfer to Toilet: with mod assist;stand pivot transfer;bedside commode Pt Will Perform Toileting - Clothing Manipulation and hygiene: with mod assist;sitting/lateral leans;sit to/from stand   OT Frequency:  Min 2X/week    Co-evaluation   Reason for Co-Treatment: Complexity of the patient's impairments (multi-system involvement);Necessary to address cognition/behavior during functional activity;For patient/therapist safety;To address functional/ADL transfers PT goals addressed during session: Mobility/safety with mobility;Proper use of DME;Strengthening/ROM OT goals addressed during session: ADL's and self-care;Strengthening/ROM      AM-PAC OT 6 Clicks Daily Activity     Outcome Measure Help from another person eating meals?: Total Help from another person taking care of personal grooming?: Total Help from another person toileting, which includes using toliet, bedpan, or urinal?: Total Help from another person bathing (including washing, rinsing,  drying)?: Total Help from another person to put on and taking off regular upper body clothing?: Total Help from another person to put on and taking off regular lower body clothing?: Total 6 Click Score: 6   End of Session Equipment Utilized During Treatment: Gait belt Nurse Communication: Mobility status;Need for lift equipment [facility] for back to bed)  Activity Tolerance: Other (comment) (Cognition) Patient left: in chair;with call bell/phone within reach;with chair alarm set  OT Visit Diagnosis: Unsteadiness on feet (R26.81);Other abnormalities of gait and mobility (R26.89);Repeated falls (R29.6);Muscle weakness (generalized) (M62.81);History of falling (Z91.81);Adult, failure to thrive (R62.7);Pain Pain - Right/Left: Right Pain - part of body: Hip                Time: 9179-9154 OT Time Calculation (min): 25 min Charges:  OT General Charges $OT Visit: 1 Visit OT Evaluation $OT Eval Moderate Complexity: 1 Mod  Ronal Gift E. Braun Rocca, OTR/L Acute Rehabilitation Services [phone]   Ronal Gift Salt 01/20/2024, 2:24 PM

## 2024-01-20 NOTE — Progress Notes (Signed)
 Orthopaedic Trauma Service Progress Note  Patient ID: Andrew Lester MRN: [id number] DOB/AGE: [birth year] 73 y.o.  Subjective:  Sitting up in chair Wife ate bedside   Review of Systems  Unable to perform ROS: Dementia    Today's  total administered Morphine  Milligram Equivalents: 0 Yesterday's total administered Morphine  Milligram Equivalents: 51  Objective:   VITALS:   Vitals:   01/20/24 0200 01/20/24 0300 01/20/24 0449 01/20/24 0801  BP: 113/78 98/61 118/69 112/62  Pulse: 68 60 61 73  Resp: 14 15 15 18   Temp: 98 F (36.7 C) 97.9 F (36.6 C) 98.2 F (36.8 C) 98 F (36.7 C)  TempSrc: Axillary Oral Axillary Oral  SpO2: 100% 100% 99% 99%  Weight:      Height:        Estimated body mass index is 24.13 kg/m as calculated from the following:   Height as of this encounter: 5' 5 (1.651 m).   Weight as of this encounter: 65.8 kg.   Intake/Output      08/31 0701 09/01 0700 09/01 0701 09/02 0700   P.O.  240   I.V. (mL/kg) 1000 (15.2)    IV Piggyback 100    Total Intake(mL/kg) 1100 (16.7) 240 (3.6)   Urine (mL/kg/hr) 250    Blood 75    Total Output 325    Net +775 +240          LABS  Results for orders placed or performed during the hospital encounter of 01/19/24 (from the past 24 hours)  CBC     Status: None   Collection Time: 01/19/24 12:42 PM  Result Value Ref Range   WBC 8.8 4.0 - 10.5 K/uL   RBC 4.33 4.22 - 5.81 MIL/uL   Hemoglobin 13.4 13.0 - 17.0 g/dL   HCT 59.7 60.9 - 47.9 %   MCV 92.8 80.0 - 100.0 fL   MCH 30.9 26.0 - 34.0 pg   MCHC 33.3 30.0 - 36.0 g/dL   RDW 86.4 88.4 - 84.4 %   Platelets 174 150 - 400 K/uL   nRBC 0.0 0.0 - 0.2 %  Basic metabolic panel with GFR     Status: Abnormal   Collection Time: 01/19/24 12:42 PM  Result Value Ref Range   Sodium 138 135 - 145 mmol/L   Potassium 4.0 3.5 - 5.1 mmol/L   Chloride 102 98 - 111 mmol/L   CO2 25 22 - 32 mmol/L    Glucose, Bld 148 (H) 70 - 99 mg/dL   BUN 10 8 - 23 mg/dL   Creatinine, Ser 8.93 0.61 - 1.24 mg/dL   Calcium 9.0 8.9 - 89.6 mg/dL   GFR, Estimated >39 >39 mL/min   Anion gap 11 5 - 15  Type and screen [facility]     Status: None   Collection Time: 01/19/24  3:25 PM  Result Value Ref Range   ABO/RH(D) A POS    Antibody Screen NEG    Sample Expiration      01/22/2024,2359 Performed at [facility], [address], [place], [place] [address]   ABO/Rh     Status: None   Collection Time: 01/19/24  3:30 PM  Result Value Ref Range   ABO/RH(D)      A POS Performed at [facility], [address] [place], [place] [address]   Glucose, capillary     Status: Abnormal   Collection Time: 01/19/24  9:49 PM  Result Value Ref Range   Glucose-Capillary 138 (H) 70 - 99 mg/dL  Basic metabolic panel     Status: Abnormal   Collection Time: 01/20/24 10:03 AM  Result Value Ref Range   Sodium 140 135 - 145 mmol/L   Potassium 4.1 3.5 - 5.1 mmol/L   Chloride 103 98 - 111 mmol/L   CO2 24 22 - 32 mmol/L   Glucose, Bld 142 (H) 70 - 99 mg/dL   BUN 11 8 - 23 mg/dL   Creatinine, Ser 8.97 0.61 - 1.24 mg/dL   Calcium 8.3 (L) 8.9 - 10.3 mg/dL   GFR, Estimated >39 >39 mL/min   Anion gap 13 5 - 15  CBC     Status: Abnormal   Collection Time: 01/20/24 10:03 AM  Result Value Ref Range   WBC 9.7 4.0 - 10.5 K/uL   RBC 3.75 (L) 4.22 - 5.81 MIL/uL   Hemoglobin 11.5 (L) 13.0 - 17.0 g/dL   HCT 65.0 (L) 60.9 - 47.9 %   MCV 93.1 80.0 - 100.0 fL   MCH 30.7 26.0 - 34.0 pg   MCHC 33.0 30.0 - 36.0 g/dL   RDW 86.3 88.4 - 84.4 %   Platelets 147 (L) 150 - 400 K/uL   nRBC 0.0 0.0 - 0.2 %     PHYSICAL EXAM:   Gen: sitting up in bed, NAD, pleasant but confused  Lungs: unlabored Ext:       Right Lower Extremity Dressing is clean, dry and intact to Right hip  Extremity is warm  No DCT  Compartments are soft  No pain out of proportion with passive stretching of his toes or  ankle  DPN, SPN, TN sensory functions are intact  EHL, FHL, lesser toe motor functions intact  Ankle flexion, extension, inversion eversion intact  + DP pulse   Assessment/Plan: 1 Day Post-Op   Principal Problem:   Closed comminuted intertrochanteric fracture of proximal femur, right, initial encounter (HCC) Active Problems:   Dementia without behavioral disturbance (HCC)   Fall at home, initial encounter   Anti-infectives (From admission, onward)    Start     Dose/Rate Route Frequency Ordered Stop   01/19/24 2330  ceFAZolin  (ANCEF ) IVPB 2g/100 mL premix        2 g 200 mL/hr over 30 Minutes Intravenous Every 6 hours 01/19/24 2133 01/20/24 0536   01/19/24 1400  ceFAZolin  (ANCEF ) IVPB 2g/100 mL premix  Status:  Discontinued        2 g 200 mL/hr over 30 Minutes Intravenous  Once 01/19/24 1440 01/19/24 2137     .  POD/HD#: 60  73 year old male ground-level fall with right hip fracture s/p intramedullary nailing   Weightbearing: WBAT RLE with walker and assistance ROM: No motion restrictions to right hip and knee Insicional and dressing care: Daily dressing changes with Mepilex or 4 x 4 gauze and tape starting on 01/22/2024 Pain management: Multimodal, minimize narcotics Foley/lines: Condom catheter is in place ID: Postop antibiotics Impediments to Fracture Healing: Osteoporosis Bone Health/Optimization: Vitamin D  levels are pending  Orthopedic device(s): Walker  Showering: Okay to shower  VTE prophylaxis: Lovenox  while inpatient.  Do not anticipate anticoagulation at discharge  Dispo: [facility] consult for SNF  Contact information: Ozell Bruch MD, Francis Mt PA-C   Francis MICAEL Mt, PA-C [phone] (C) 01/20/2024, 11:25 AM  [facility]  [address] [place] [address] [phone] MAXIMINO MILLING (F)    After 5pm and on the weekends please log on to Amion, go to orthopaedics and the look under the [facility] Call for the  provider(s) on call. You can also call our office at [phone] and then follow the prompts to be connected to the call team.  Patient ID: Andrew Lester, male   DOB: 1950-10-13, 73 y.o.   MRN: [id number]

## 2024-01-20 NOTE — Evaluation (Signed)
 Physical Therapy Evaluation  Patient Details Name: Andrew Lester MRN: [id number] DOB: 11-Nov-1950 Today's Date: 01/20/2024  History of Present Illness  Patient is a 73 yo male presenting to the ED after fall on 01/19/24. R hip fracture noted, with R IM nail completed on 8/31. PMH: dementia, Gout, HTN.  Clinical Impression  Pt admitted with above diagnosis. Pt currently with functional limitations due to the deficits listed below (see PT Problem List). At the time of PT eval pt was able to perform transfers with up to +2 max assist for balance support and safety. Pt currently requiring consistent, heavy +2 assist for all aspects of mobility at this time. Pt's wife present throughout session and reports she would like to take pt home at d/c. Recommend continued skilled PT services <3 hours/day at d/c to maximize functional independence, safety, and decrease caregiver burden before return home with family support. Pt will benefit from acute skilled PT to increase their independence and safety with mobility to allow discharge.           If plan is discharge home, recommend the following: Two people to help with walking and/or transfers;Two people to help with bathing/dressing/bathroom;Assistance with cooking/housework;Help with stairs or ramp for entrance;Assist for transportation;Supervision due to cognitive status   Can travel by private vehicle   No    Equipment Recommendations Rolling walker (2 wheels)  Recommendations for Other Services       Functional Status Assessment Patient has had a recent decline in their functional status and demonstrates the ability to make significant improvements in function in a reasonable and predictable amount of time.     Precautions / Restrictions Precautions Precautions: Fall Recall of Precautions/Restrictions: Impaired Restrictions Weight Bearing Restrictions Per Provider Order: Yes RLE Weight Bearing Per Provider Order: Weight bearing as tolerated       Mobility  Bed Mobility Overal bed mobility: Needs Assistance Bed Mobility: Rolling, Sidelying to Sit Rolling: Max assist, +2 for physical assistance Sidelying to sit: Max assist, +2 for physical assistance       General bed mobility comments: Pt not following commands for bed mobility, reaching for rail, or opening eyes. Opted for log roll to achieve EOB. Once sitting, pt smiling and conversant, however wanting to keep eyes closed.    Transfers Overall transfer level: Needs assistance Equipment used: 2 person hand held assist Transfers: Sit to/from Stand, Bed to chair/wheelchair/BSC Sit to Stand: Max assist, +2 physical assistance, From elevated surface Stand pivot transfers: Max assist, +2 physical assistance, From elevated surface         General transfer comment: heavy +2 max assist to power up to full stand. Pt able to tolerate 1 stand, and 1 SPT to chair. Pt not advancing LE's towards chair and was a true pivot.    Ambulation/Gait               General Gait Details: Unable to progress to gait training at this time.  Stairs            Wheelchair Mobility     Tilt Bed    Modified Rankin (Stroke Patients Only)       Balance Overall balance assessment: Needs assistance Sitting-balance support: Feet supported, Bilateral upper extremity supported Sitting balance-Leahy Scale: Poor Sitting balance - Comments: Hands on guarding provided almost constantly   Standing balance support: Bilateral upper extremity supported Standing balance-Leahy Scale: Zero Standing balance comment: +2 assist required  Pertinent Vitals/Pain Pain Assessment Pain Assessment: Faces Faces Pain Scale: Hurts even more Pain Location: R hip during mobility Pain Descriptors / Indicators: Operative site guarding, Sharp, Moaning, Grimacing Pain Intervention(s): Limited activity within patient's tolerance, Monitored during session, Repositioned     Home Living Family/patient expects to be discharged to:: Private residence Living Arrangements: Spouse/significant other;Other relatives (Grandsons) Available Help at Discharge: Family;Available 24 hours/day Type of Home: House Home Access: Level entry (threshold step)       Home Layout: One level Home Equipment: None      Prior Function Prior Level of Function : Needs assist;History of Falls (last six months)  Cognitive Assist : Mobility (cognitive);ADLs (cognitive) Mobility (Cognitive): Step by step cues ADLs (Cognitive): Step by step cues Physical Assist : ADLs (physical)   ADLs (physical): Grooming;Dressing;Bathing;Toileting;IADLs;Feeding Mobility Comments: walked without AD, wife endorses nearly one fall a week ADLs Comments: needed assist for all ADLs, progressively more forgetful     Extremity/Trunk Assessment   Upper Extremity Assessment Upper Extremity Assessment: Defer to OT evaluation    Lower Extremity Assessment Lower Extremity Assessment: Generalized weakness;RLE deficits/detail RLE Deficits / Details: Acute pain, decreased strength and AROM consistent with above mentioned surgery. RLE: Unable to fully assess due to pain    Cervical / Trunk Assessment Cervical / Trunk Assessment: Other exceptions Cervical / Trunk Exceptions: Forward head posture with rounded shoulders  Communication   Communication Communication: No apparent difficulties    Cognition Arousal: Alert Behavior During Therapy: WFL for tasks assessed/performed   PT - Cognitive impairments: History of cognitive impairments                       PT - Cognition Comments: kept eyes closed throughout session. Would open briefly to command (intermittently). Pt followed <25% commands throughout session. Following commands: Impaired Following commands impaired: Follows one step commands inconsistently     Cueing Cueing Techniques: Verbal cues, Gestural cues     General Comments       Exercises     Assessment/Plan    PT Assessment Patient needs continued PT services  PT Problem List Decreased strength;Decreased range of motion;Decreased activity tolerance;Decreased balance;Decreased mobility;Decreased knowledge of use of DME;Decreased safety awareness;Decreased knowledge of precautions;Pain       PT Treatment Interventions DME instruction;Gait training;Stair training;Functional mobility training;Therapeutic activities;Therapeutic exercise;Balance training;Patient/family education    PT Goals (Current goals can be found in the Care Plan section)  Acute Rehab PT Goals Patient Stated Goal: Wife wishes to take pt home at d/c PT Goal Formulation: Patient unable to participate in goal setting Time For Goal Achievement: 02/03/24 Potential to Achieve Goals: Good    Frequency Min 3X/week     Co-evaluation PT/OT/SLP Co-Evaluation/Treatment: Yes Reason for Co-Treatment: Complexity of the patient's impairments (multi-system involvement);Necessary to address cognition/behavior during functional activity;For patient/therapist safety;To address functional/ADL transfers PT goals addressed during session: Mobility/safety with mobility;Proper use of DME;Strengthening/ROM OT goals addressed during session: ADL's and self-care;Strengthening/ROM       AM-PAC PT 6 Clicks Mobility  Outcome Measure Help needed turning from your back to your side while in a flat bed without using bedrails?: Total Help needed moving from lying on your back to sitting on the side of a flat bed without using bedrails?: Total Help needed moving to and from a bed to a chair (including a wheelchair)?: Total Help needed standing up from a chair using your arms (e.g., wheelchair or bedside chair)?: Total Help needed to walk in hospital room?: Total  Help needed climbing 3-5 steps with a railing? : Total 6 Click Score: 6    End of Session Equipment Utilized During Treatment: Gait belt Activity  Tolerance: Patient tolerated treatment well Patient left: with call bell/phone within reach;with family/visitor present;in chair;with chair alarm set Nurse Communication: Mobility status;Need for lift equipment Laurent for back to bed with nursing staff) PT Visit Diagnosis: Unsteadiness on feet (R26.81);Pain Pain - Right/Left: Right Pain - part of body: Hip    Time: 0820-0847 PT Time Calculation (min) (ACUTE ONLY): 27 min   Charges:   PT Evaluation $PT Eval Moderate Complexity: 1 Mod   PT General Charges $$ ACUTE PT VISIT: 1 Visit         Leita Sable, PT, DPT Acute Rehabilitation Services Secure Chat Preferred Office: [phone]   Leita JONETTA Sable 01/20/2024, 9:16 AM

## 2024-01-20 NOTE — Progress Notes (Addendum)
 Initial Nutrition Assessment  DOCUMENTATION CODES:   Not applicable  INTERVENTION:   Continue Regular diet with feeding assistance. Change to room service with assistance for meal ordering.   Ensure Plus High Protein po BID, each supplement provides 350 kcal and 20 grams of protein.  Add Magic cup BID with meals, each supplement provides 290 kcal and 9 grams of protein   NUTRITION DIAGNOSIS:   Increased nutrient needs related to post-op healing as evidenced by estimated needs.  GOAL:   Patient will meet greater than or equal to 90% of their needs  MONITOR:   PO intake, Supplement acceptance, Weight trends  REASON FOR ASSESSMENT:   Consult Hip fracture protocol  ASSESSMENT:   Pt with PMH of dementia who lives at home with wife who is his caregiver (pt unbalanced at home but does not use any assistive devices for walking, he is difficult to understand and requires assistance with eating) admitted after a fall with R hip fx.   Pt had IM nailing of his R hip and became more lethargic post op with monitoring from rapid. Pt remains on 2 L O2 via [place].    8/31 - s/p IM nailing of R hip   Medications reviewed and include: colace NS @ 75 ml/hr   Labs reviewed:  CBG 138   Admission weight appears stated at 145 lb Noted pt's weight was documented at 176 lb in 2020.   NUTRITION - FOCUSED PHYSICAL EXAM:  Deferred to follow up  Diet Order:   Diet Order             Diet regular Room service appropriate? Yes; Fluid consistency: Thin  Diet effective now                   EDUCATION NEEDS:   Not appropriate for education at this time  Skin:  Skin Assessment: Skin Integrity Issues: Skin Integrity Issues:: Incisions Incisions: R hip (surgical)  Last BM:  8/30  Height:   Ht Readings from Last 1 Encounters:  01/19/24 5' 5 (1.651 m)    Weight:   Wt Readings from Last 1 Encounters:  01/19/24 65.8 kg    BMI:  Body mass index is 24.13 kg/m.  Estimated  Nutritional Needs:   Kcal:  1800-2000  Protein:  100-110 grams  Fluid:  > 1.8 L/day  Powell SQUIBB., RD, LDN, CNSC See AMiON for contact information

## 2024-01-20 NOTE — Plan of Care (Signed)
  Problem: Clinical Measurements: Goal: Ability to maintain clinical measurements within normal limits will improve Outcome: Progressing Goal: Will remain free from infection Outcome: Progressing   Problem: Safety: Goal: Ability to remain free from injury will improve Outcome: Progressing   

## 2024-01-20 NOTE — Evaluation (Signed)
 Clinical/Bedside Swallow Evaluation Patient Details  Name: Andrew Lester MRN: [id number] Date of Birth: [birth year]  Today's Date: 01/20/2024 Time: SLP Start Time (ACUTE ONLY): 9056 SLP Stop Time (ACUTE ONLY): 0950 SLP Time Calculation (min) (ACUTE ONLY): 7 min  Past Medical History:  Past Medical History:  Diagnosis Date   Dementia (HCC)    ED (erectile dysfunction)    Gout    History of kidney stones    Hypertension    Kidney stones    Prediabetes    Past Surgical History:  Past Surgical History:  Procedure Laterality Date   CHOLECYSTECTOMY N/A 10/08/2022   Procedure: LAPAROSCOPIC CHOLECYSTECTOMY;  Surgeon: Ebbie Cough, MD;  Location: [facility] OR;  Service: General;  Laterality: N/A;   COLONOSCOPY  08/10/2005   HPI:  Patient is a 73 yo male presenting to the ED after fall on 01/19/24. R hip fracture noted, with R IM nail completed on 8/31. PMH: dementia, Gout, HTN. CXR no active disease.    Assessment / Plan / Recommendation  Clinical Impression  Wife present and denies difficulty swallowing except intermittent holding of pills with water. His dentition is intact and he was unable to follow commands for oromotor exam although not overt abnormalities noted. His oropharyngeal phase of swallow appears within functional limits from subjective view. No s/s aspiration with straw sips thin or applesauce. Initially expectorated cracker but allowed it to be placed in mouth with increased awareness of solid and initiated mastication briskly and cleared oral cavity. Recommend continue regular/thin and crush pills while in hospital. Discussed with wife who requested from MD that meds be prescribed in liquid form for home if possible. Educated wife she could try pill whole in applesauce and if holds, then can obtain a pill crusher to crush if pills are not in liquid form. No further ST needed. SLP Visit Diagnosis: Dysphagia, unspecified (R13.10)    Aspiration Risk  Mild aspiration risk;Other  (comment) (hx of dementia)    Diet Recommendation Regular;Thin liquid    Liquid Administration via: Cup;Straw Medication Administration: Crushed with puree Compensations: Slow rate;Small sips/bites Postural Changes: Seated upright at 90 degrees    Other  Recommendations Oral Care Recommendations: Oral care BID     Assistance Recommended at Discharge    Functional Status Assessment Patient has not had a recent decline in their functional status  Frequency and Duration            Prognosis        Swallow Study   General Date of Onset: 01/19/24 HPI: Patient is a 73 yo male presenting to the ED after fall on 01/19/24. R hip fracture noted, with R IM nail completed on 8/31. PMH: dementia, Gout, HTN. CXR no active disease. Type of Study: Bedside Swallow Evaluation Previous Swallow Assessment:  (none) Diet Prior to this Study: Regular;Thin liquids (Level 0) Temperature Spikes Noted: No Respiratory Status: Nasal cannula History of Recent Intubation: Yes Total duration of intubation (days):  (during surgery) Date extubated: 01/19/24 Behavior/Cognition: Alert;Cooperative;Pleasant mood;Confused;Requires cueing Oral Cavity Assessment: Within Functional Limits Oral Care Completed by SLP: No Oral Cavity - Dentition: Adequate natural dentition Vision: Functional for self-feeding Self-Feeding Abilities: Needs assist Patient Positioning: Upright in chair Baseline Vocal Quality: Normal Volitional Cough: Cognitively unable to elicit Volitional Swallow: Unable to elicit    Oral/Motor/Sensory Function Overall Oral Motor/Sensory Function:  (attempted but was unable due to dementia, decreased attention)   Ice Chips Ice chips: Not tested   Thin Liquid Thin Liquid: Within functional limits Presentation: Straw  Nectar Thick Nectar Thick Liquid: Not tested   Honey Thick Honey Thick Liquid: Not tested   Puree Puree: Within functional limits   Solid     Solid: Within functional limits       [name] 01/20/2024,10:10 AM

## 2024-01-21 ENCOUNTER — Encounter (HOSPITAL_COMMUNITY): Payer: Self-pay | Admitting: Orthopedic Surgery

## 2024-01-21 DIAGNOSIS — S72141A Displaced intertrochanteric fracture of right femur, initial encounter for closed fracture: Secondary | ICD-10-CM | POA: Diagnosis not present

## 2024-01-21 LAB — COMPREHENSIVE METABOLIC PANEL WITH GFR
ALT: 39 U/L (ref 0–44)
AST: 38 U/L (ref 15–41)
Albumin: 3 g/dL — ABNORMAL LOW (ref 3.5–5.0)
Alkaline Phosphatase: 32 U/L — ABNORMAL LOW (ref 38–126)
Anion gap: 10 (ref 5–15)
BUN: 10 mg/dL (ref 8–23)
CO2: 24 mmol/L (ref 22–32)
Calcium: 8.2 mg/dL — ABNORMAL LOW (ref 8.9–10.3)
Chloride: 101 mmol/L (ref 98–111)
Creatinine, Ser: 0.67 mg/dL (ref 0.61–1.24)
GFR, Estimated: >60 mL/min (ref >60)
Glucose, Bld: 95 mg/dL (ref 70–99)
Potassium: 3.7 mmol/L (ref 3.5–5.1)
Sodium: 135 mmol/L (ref 135–145)
Total Bilirubin: 0.9 mg/dL (ref 0.0–1.2)
Total Protein: 5.5 g/dL — ABNORMAL LOW (ref 6.5–8.1)

## 2024-01-21 LAB — CBC
HCT: 33.1 % — ABNORMAL LOW (ref 39.0–52.0)
Hemoglobin: 11.3 g/dL — ABNORMAL LOW (ref 13.0–17.0)
MCH: 31.4 pg (ref 26.0–34.0)
MCHC: 34.1 g/dL (ref 30.0–36.0)
MCV: 91.9 fL (ref 80.0–100.0)
Platelets: 131 K/uL — ABNORMAL LOW (ref 150–400)
RBC: 3.6 MIL/uL — ABNORMAL LOW (ref 4.22–5.81)
RDW: 13.4 % (ref 11.5–15.5)
WBC: 8.5 K/uL (ref 4.0–10.5)
nRBC: 0 % (ref 0.0–0.2)

## 2024-01-21 LAB — MAGNESIUM: Magnesium: 1.7 mg/dL (ref 1.7–2.4)

## 2024-01-21 MED ORDER — VITAMIN D (ERGOCALCIFEROL) 1.25 MG (50000 UNIT) PO CAPS
50000.0000 [IU] | ORAL_CAPSULE | ORAL | Status: DC
  Administered 2024-01-21: 50000 [IU] via ORAL
  Filled 2024-01-21: qty 1

## 2024-01-21 NOTE — TOC Progression Note (Signed)
 Transition of Care [facility]) - Progression Note    Patient Details  Name: Andrew Lester MRN: [id number] Date of Birth: July 31, 1950  [facility] CM/SW Contact  Bridget Cordella Simmonds, LCSW Phone Number: 01/21/2024, 8:54 AM  Clinical Narrative:   CSW spoke with pt wife regarding potential DC to SNF.  Wife continues to say she does not want to pursue SNF.  Reports she has 2 grandsons who will be home to assist her and she prefers for pt to DC home with [facility].  Long term plan is to move in with her son, where she will have additional help.     Expected Discharge Plan: Home w Home Health Services (wife declined SNF placement) Barriers to Discharge: No Barriers Identified               Expected Discharge Plan and Services   Discharge Planning Services: CM Consult                                           Social Drivers of Health (SDOH) Interventions SDOH Screenings   Social Connections: Unknown (10/03/2021)   Received from [facility]  Tobacco Use: Low Risk  (01/19/2024)    Readmission Risk Interventions     No data to display

## 2024-01-21 NOTE — Progress Notes (Signed)
 PT Cancellation Note  Patient Details Name: Andrew Lester MRN: [id number] DOB: Aug 10, 1950   Cancelled Treatment:    Reason Eval/Treat Not Completed: (P) Fatigue/lethargy limiting ability to participate, pt sleeping on arrival. Spouse at bedside requesting to allow pt to continue to rest, reporting pt was awake most of night and when he has a bad night he will sleep for most of next day. Will check back as schedule allows to continue with PT POC.  Therisa SAUNDERS. PTA Acute Rehabilitation Services Office: [phone]     Therisa CHRISTELLA Boor 01/21/2024, 10:25 AM

## 2024-01-21 NOTE — Progress Notes (Signed)
 PROGRESS NOTE  Andrew Lester FMW:[id number] DOB: 06-19-1950 DOA: 01/19/2024 PCP: Okey Carlin Redbird, MD   LOS: 2 days   Brief Narrative / Interim history: 73 year old male with dementia, BPH who comes into the hospital after a fall.  He was found to have a hip fracture and repaired on 8/31  Subjective / 24h Interval events: He is doing well this morning, has underlying confusion but appears calm, pleasant.  Wife is at bedside.  Assesement and Plan: Principal problem Closed right intertrochanteric hip fracture -patient was admitted to the hospital after a fall, found to have a hip fracture.  He was taken to the OR by Dr. Celena on 8/31, status post IM nailing of the right hip - PT/OT recommends SNF, wife would prefer for him to go home but realizes that there may be some limitations.  He did not do well at all with therapy yesterday, requiring +2 max assist.  PT to reengage today, wife is open to rehab if it is absolutely necessary.  Active problems Dementia-supportive care, delirium precautions   Scheduled Meds:  docusate sodium   100 mg Oral BID   enoxaparin  (LOVENOX ) injection  40 mg Subcutaneous Q24H   feeding supplement  237 mL Oral BID BM   Vitamin D  (Ergocalciferol )  50,000 Units Oral Q7 days   Continuous Infusions:  sodium chloride  75 mL/hr at 01/20/24 2120   PRN Meds:.HYDROcodone -acetaminophen , menthol -cetylpyridinium **OR** phenol, metoCLOPramide  **OR** metoCLOPramide  (REGLAN ) injection, morphine  injection, ondansetron  **OR** ondansetron  (ZOFRAN ) IV  Current Outpatient Medications  Medication Instructions   Oxymetazoline HCl (SINEX LONG-ACTING NA) 1 spray, Daily PRN   tamsulosin  (FLOMAX ) 0.4 mg, Oral, Every other day    Diet Orders (From admission, onward)     Start     Ordered   01/19/24 2134  Diet regular Room service appropriate? Yes; Fluid consistency: Thin  Diet effective now       Question Answer Comment  Room service appropriate? Yes   Fluid consistency: Thin       01/19/24 2133            DVT prophylaxis: enoxaparin  (LOVENOX ) injection 40 mg Start: 01/19/24 2200 SCDs Start: 01/19/24 2134   Lab Results  Component Value Date   PLT 131 (L) 01/21/2024      Code Status: Full Code  Family Communication: wife at bedside   Status is: Inpatient Remains inpatient appropriate because: Severity of illness   Level of care: Med-Surg  Consultants:  Orthopedic surgery   Objective: Vitals:   01/20/24 2035 01/21/24 0000 01/21/24 0400 01/21/24 0807  BP: 113/62 (!) 143/76 (!) 141/81 129/69  Pulse: 81 92 91 88  Resp: 19   17  Temp: 98.3 F (36.8 C) 98.4 F (36.9 C) 98 F (36.7 C) 98.7 F (37.1 C)  TempSrc: Axillary Axillary Axillary Oral  SpO2: 95% 95% 100%   Weight:      Height:        Intake/Output Summary (Last 24 hours) at 01/21/2024 1011 Last data filed at 01/21/2024 0423 Gross per 24 hour  Intake 2198.45 ml  Output 900 ml  Net 1298.45 ml   Wt Readings from Last 3 Encounters:  01/19/24 65.8 kg  10/08/22 68 kg  10/01/22 67.2 kg    Examination:  Constitutional: NAD Eyes: lids and conjunctivae normal, no scleral icterus ENMT: mmm Neck: normal, supple Respiratory: clear to auscultation bilaterally, no wheezing, no crackles. Normal respiratory effort.  Cardiovascular: Regular rate and rhythm, no murmurs / rubs / gallops. No LE edema. Abdomen: soft,  no distention, no tenderness. Bowel sounds positive.    Data Reviewed: I have independently reviewed following labs and imaging studies   CBC Recent Labs  Lab 01/19/24 1242 01/20/24 1003 01/21/24 0604  WBC 8.8 9.7 8.5  HGB 13.4 11.5* 11.3*  HCT 40.2 34.9* 33.1*  PLT 174 147* 131*  MCV 92.8 93.1 91.9  MCH 30.9 30.7 31.4  MCHC 33.3 33.0 34.1  RDW 13.5 13.6 13.4    Recent Labs  Lab 01/19/24 1242 01/20/24 1003 01/21/24 0604  NA 138 140 135  K 4.0 4.1 3.7  CL 102 103 101  CO2 25 24 24   GLUCOSE 148* 142* 95  BUN 10 11 10   CREATININE 1.06 1.02 0.67  CALCIUM  9.0 8.3* 8.2*  AST  --   --  38  ALT  --   --  39  ALKPHOS  --   --  32*  BILITOT  --   --  0.9  ALBUMIN  --   --  3.0*  MG  --   --  1.7    ------------------------------------------------------------------------------------------------------------------ No results for input(s): CHOL, HDL, LDLCALC, TRIG, CHOLHDL, LDLDIRECT in the last 72 hours.  No results found for: HGBA1C ------------------------------------------------------------------------------------------------------------------ No results for input(s): TSH, T4TOTAL, T3FREE, THYROIDAB in the last 72 hours.  Invalid input(s): FREET3  Cardiac Enzymes No results for input(s): CKMB, TROPONINI, MYOGLOBIN in the last 168 hours.  Invalid input(s): CK ------------------------------------------------------------------------------------------------------------------ No results found for: BNP  CBG: Recent Labs  Lab 01/19/24 2149  GLUCAP 138*    No results found for this or any previous visit (from the past 240 hours).   Radiology Studies: No results found.    Nilda Fendt, MD, PhD [facility]  Between 7 am - 7 pm I am available, please contact me via Amion (for emergencies) or Securechat (non urgent messages)  Between 7 pm - 7 am I am not available, please contact night coverage MD/APP via Amion

## 2024-01-21 NOTE — Plan of Care (Signed)

## 2024-01-21 NOTE — Progress Notes (Signed)
 Physical Therapy Treatment Patient Details Name: Andrew Lester MRN: [id number] DOB: October 22, 1950 Today's Date: 01/21/2024   History of Present Illness Patient is a 73 yo male presenting to the ED after fall on 01/19/24. R hip fracture noted, with R IM nail completed on 8/31. PMH: dementia, Gout, HTN.    PT Comments  Pt resting in bed on arrival, awake and alert and with improved participation in session. Pt able to follow short simple functional commands (come with me, lets walk) ~50% of time this session. Pt continues to require increased assist up to max A +2 to complete bed mobility, transfers to stand and short in room gait with HHA x2. Pt with impaired balance throughout session, in sitting and standing, with pt demonstrating L lateral lean throughout due to RLE pain. Pt spouse present and supportive throughout session. Pt up in chair at end of session. Current plan remains appropriate to address deficits and maximize functional independence and decrease caregiver burden. Pt continues to benefit from skilled PT services to progress toward functional mobility goals.     If plan is discharge home, recommend the following: Two people to help with walking and/or transfers;Two people to help with bathing/dressing/bathroom;Assistance with cooking/housework;Help with stairs or ramp for entrance;Assist for transportation;Supervision due to cognitive status   Can travel by private vehicle     No  Equipment Recommendations  Rolling walker (2 wheels)    Recommendations for Other Services       Precautions / Restrictions Precautions Precautions: Fall Recall of Precautions/Restrictions: Impaired Restrictions Weight Bearing Restrictions Per Provider Order: Yes RLE Weight Bearing Per Provider Order: Weight bearing as tolerated     Mobility  Bed Mobility Overal bed mobility: Needs Assistance Bed Mobility: Rolling, Sidelying to Sit Rolling: Mod assist Sidelying to sit: +2 for physical assistance,  Mod assist       General bed mobility comments: pt following simple funtional commands this session come with me to initiate bed mobility, needing mod A to complete due to pain and L alteral lean away from painful R side    Transfers Overall transfer level: Needs assistance Equipment used: 2 person hand held assist Transfers: Sit to/from Stand, Bed to chair/wheelchair/BSC Sit to Stand: Max assist, +2 physical assistance, From elevated surface           General transfer comment: max +2 to power up with pt stepping LLE up to start ambulation prior to standing, posterior lean initially    Ambulation/Gait Ambulation/Gait assistance: Max assist, +2 physical assistance Gait Distance (Feet): 5 Feet Assistive device: 2 person hand held assist Gait Pattern/deviations: Step-to pattern, Narrow base of support Gait velocity: decr     General Gait Details: pt stepping toward chair with very narrow almost scissoring steps, L lateral lean throughout and max A +2 to maintain balance   Stairs             Wheelchair Mobility     Tilt Bed    Modified Rankin (Stroke Patients Only)       Balance Overall balance assessment: Needs assistance Sitting-balance support: Feet supported, Bilateral upper extremity supported Sitting balance-Leahy Scale: Poor Sitting balance - Comments: Hands on guarding provided almost constantly   Standing balance support: Bilateral upper extremity supported Standing balance-Leahy Scale: Zero Standing balance comment: +2 assist required                            Communication Communication Communication: No apparent difficulties  Cognition  Arousal: Alert Behavior During Therapy: WFL for tasks assessed/performed   PT - Cognitive impairments: History of cognitive impairments                         Following commands: Impaired Following commands impaired: Follows one step commands inconsistently    Cueing Cueing  Techniques: Verbal cues, Gestural cues  Exercises      General Comments General comments (skin integrity, edema, etc.): VSS on RA, pt spouse present and supportive      Pertinent Vitals/Pain Pain Assessment Pain Assessment: Faces Faces Pain Scale: Hurts little more Pain Location: R hip during mobility Pain Descriptors / Indicators: Operative site guarding, Sharp, Moaning, Grimacing Pain Intervention(s): Monitored during session, Limited activity within patient's tolerance    Home Living                          Prior Function            PT Goals (current goals can now be found in the care plan section) Acute Rehab PT Goals Patient Stated Goal: Wife wishes to take pt home at d/c PT Goal Formulation: Patient unable to participate in goal setting Time For Goal Achievement: 02/03/24 Progress towards PT goals: Progressing toward goals    Frequency    Min 3X/week      PT Plan      Co-evaluation              AM-PAC PT 6 Clicks Mobility   Outcome Measure  Help needed turning from your back to your side while in a flat bed without using bedrails?: Total Help needed moving from lying on your back to sitting on the side of a flat bed without using bedrails?: Total Help needed moving to and from a bed to a chair (including a wheelchair)?: Total Help needed standing up from a chair using your arms (e.g., wheelchair or bedside chair)?: Total Help needed to walk in hospital room?: Total Help needed climbing 3-5 steps with a railing? : Total 6 Click Score: 6    End of Session Equipment Utilized During Treatment: Gait belt Activity Tolerance: Patient tolerated treatment well Patient left: with call bell/phone within reach;with family/visitor present;in chair;with chair alarm set Nurse Communication: Mobility status;Need for lift equipment PT Visit Diagnosis: Unsteadiness on feet (R26.81);Pain Pain - Right/Left: Right Pain - part of body: Hip     Time:  1440-1502 PT Time Calculation (min) (ACUTE ONLY): 22 min  Charges:    $Therapeutic Activity: 8-22 mins PT General Charges $$ ACUTE PT VISIT: 1 Visit                     Ahana Najera R. PTA Acute Rehabilitation Services Office: [phone]   Therisa CHRISTELLA Boor 01/21/2024, 4:14 PM

## 2024-01-21 NOTE — Progress Notes (Signed)
 Orthopaedic Trauma Service Progress Note  Patient ID: Andrew Lester MRN: [id number] DOB/AGE: [birth year] 73 y.o.  Subjective:  Wife at bedside Reports restless night for patient He is sleeping soundly now   Sat in chair for 3-4 hours yesterday and tried to get up on his own to go back to bed    Review of Systems  Unable to perform ROS: Dementia    Today's  total administered Morphine  Milligram Equivalents: 0 Yesterday's total administered Morphine  Milligram Equivalents: 10  Objective:   VITALS:   Vitals:   01/20/24 2035 01/21/24 0000 01/21/24 0400 01/21/24 0807  BP: 113/62 (!) 143/76 (!) 141/81 129/69  Pulse: 81 92 91 88  Resp: 19   17  Temp: 98.3 F (36.8 C) 98.4 F (36.9 C) 98 F (36.7 C) 98.7 F (37.1 C)  TempSrc: Axillary Axillary Axillary Oral  SpO2: 95% 95% 100%   Weight:      Height:        Estimated body mass index is 24.13 kg/m as calculated from the following:   Height as of this encounter: 5' 5 (1.651 m).   Weight as of this encounter: 65.8 kg.   Intake/Output      09/01 0701 09/02 0700 09/02 0701 09/03 0700   P.O. 720    I.V. (mL/kg) 1718.5 (26.1)    IV Piggyback     Total Intake(mL/kg) 2438.5 (37.1)    Urine (mL/kg/hr) 900 (0.6)    Blood     Total Output 900    Net +1538.5         Urine Occurrence 1 x      LABS  Results for orders placed or performed during the hospital encounter of 01/19/24 (from the past 24 hours)  Comprehensive metabolic panel with GFR     Status: Abnormal   Collection Time: 01/21/24  6:04 AM  Result Value Ref Range   Sodium 135 135 - 145 mmol/L   Potassium 3.7 3.5 - 5.1 mmol/L   Chloride 101 98 - 111 mmol/L   CO2 24 22 - 32 mmol/L   Glucose, Bld 95 70 - 99 mg/dL   BUN 10 8 - 23 mg/dL   Creatinine, Ser 9.32 0.61 - 1.24 mg/dL   Calcium 8.2 (L) 8.9 - 10.3 mg/dL   Total Protein 5.5 (L) 6.5 - 8.1 g/dL   Albumin 3.0 (L) 3.5 - 5.0 g/dL    AST 38 15 - 41 U/L   ALT 39 0 - 44 U/L   Alkaline Phosphatase 32 (L) 38 - 126 U/L   Total Bilirubin 0.9 0.0 - 1.2 mg/dL   GFR, Estimated >39 >39 mL/min   Anion gap 10 5 - 15  CBC     Status: Abnormal   Collection Time: 01/21/24  6:04 AM  Result Value Ref Range   WBC 8.5 4.0 - 10.5 K/uL   RBC 3.60 (L) 4.22 - 5.81 MIL/uL   Hemoglobin 11.3 (L) 13.0 - 17.0 g/dL   HCT 66.8 (L) 60.9 - 47.9 %   MCV 91.9 80.0 - 100.0 fL   MCH 31.4 26.0 - 34.0 pg   MCHC 34.1 30.0 - 36.0 g/dL   RDW 86.5 88.4 - 84.4 %   Platelets 131 (L) 150 - 400 K/uL   nRBC 0.0 0.0 - 0.2 %  Magnesium  Status: None   Collection Time: 01/21/24  6:04 AM  Result Value Ref Range   Magnesium 1.7 1.7 - 2.4 mg/dL     Latest Reference Range & Units 01/20/24 10:03  Vitamin D , 25-Hydroxy 30 - 100 ng/mL 12.51 (L)  (L): Data is abnormally low PHYSICAL EXAM:   Gen: in bed, sleeping, comfortable appearing  Lungs: unlabored Ext:       Right Lower Extremity Dressings are clean, dry and intact to Right hip             Extremity is warm             No DCT             Compartments are soft             No pain out of proportion with passive stretching of his toes or ankle               Assessment/Plan: 2 Days Post-Op   Principal Problem:   Closed comminuted intertrochanteric fracture of proximal femur, right, initial encounter (HCC) Active Problems:   Dementia without behavioral disturbance (HCC)   Fall at home, initial encounter   Anti-infectives (From admission, onward)    Start     Dose/Rate Route Frequency Ordered Stop   01/19/24 2330  ceFAZolin  (ANCEF ) IVPB 2g/100 mL premix        2 g 200 mL/hr over 30 Minutes Intravenous Every 6 hours 01/19/24 2133 01/20/24 0536   01/19/24 1400  ceFAZolin  (ANCEF ) IVPB 2g/100 mL premix  Status:  Discontinued        2 g 200 mL/hr over 30 Minutes Intravenous  Once 01/19/24 1440 01/19/24 2137     .  POD/HD#: 6  73 year old male ground-level fall with right hip fracture s/p  intramedullary nailing   Weightbearing: WBAT RLE with walker and assistance ROM: No motion restrictions to right hip and knee Insicional and dressing care: Daily dressing changes with Mepilex or 4 x 4 gauze and tape starting on 01/22/2024 Pain management: Multimodal, minimize narcotics Foley/lines: Condom catheter is in place ID: Postop antibiotics Impediments to Fracture Healing: Osteoporosis Bone Health/Optimization: Vitamin D  levels are deficient, supplement, RD consult    Orthopedic device(s): Walker   Showering: Okay to shower   VTE prophylaxis: Lovenox  while inpatient.  Do not anticipate anticoagulation at discharge   Dispo: [facility] consult for SNF   Contact information: Ozell Bruch MD, Francis Mt PA-C   Francis MICAEL Mt, PA-C [phone] (C) 01/21/2024, 10:06 AM  [facility] [address] [place] [address] [phone] MAXIMINO MILLING (F)    After 5pm and on the weekends please log on to Amion, go to orthopaedics and the look under the [facility] Call for the provider(s) on call. You can also call our office at [phone] and then follow the prompts to be connected to the call team.  Patient ID: Andrew Lester, male   DOB: 09/01/1950, 73 y.o.   MRN: [id number]

## 2024-01-22 ENCOUNTER — Other Ambulatory Visit (HOSPITAL_COMMUNITY): Payer: Self-pay

## 2024-01-22 DIAGNOSIS — S72141A Displaced intertrochanteric fracture of right femur, initial encounter for closed fracture: Secondary | ICD-10-CM | POA: Diagnosis not present

## 2024-01-22 DIAGNOSIS — E43 Unspecified severe protein-calorie malnutrition: Secondary | ICD-10-CM | POA: Insufficient documentation

## 2024-01-22 LAB — CBC
HCT: 28.8 % — ABNORMAL LOW (ref 39.0–52.0)
Hemoglobin: 9.9 g/dL — ABNORMAL LOW (ref 13.0–17.0)
MCH: 31.3 pg (ref 26.0–34.0)
MCHC: 34.4 g/dL (ref 30.0–36.0)
MCV: 91.1 fL (ref 80.0–100.0)
Platelets: 128 K/uL — ABNORMAL LOW (ref 150–400)
RBC: 3.16 MIL/uL — ABNORMAL LOW (ref 4.22–5.81)
RDW: 13.2 % (ref 11.5–15.5)
WBC: 6.5 K/uL (ref 4.0–10.5)
nRBC: 0 % (ref 0.0–0.2)

## 2024-01-22 LAB — BASIC METABOLIC PANEL WITH GFR
Anion gap: 9 (ref 5–15)
BUN: 8 mg/dL (ref 8–23)
CO2: 26 mmol/L (ref 22–32)
Calcium: 8.1 mg/dL — ABNORMAL LOW (ref 8.9–10.3)
Chloride: 101 mmol/L (ref 98–111)
Creatinine, Ser: 0.63 mg/dL (ref 0.61–1.24)
GFR, Estimated: >60 mL/min (ref >60)
Glucose, Bld: 98 mg/dL (ref 70–99)
Potassium: 3.3 mmol/L — ABNORMAL LOW (ref 3.5–5.1)
Sodium: 136 mmol/L (ref 135–145)

## 2024-01-22 LAB — MAGNESIUM: Magnesium: 1.9 mg/dL (ref 1.7–2.4)

## 2024-01-22 MED ORDER — HYDROCODONE-ACETAMINOPHEN 5-325 MG PO TABS
1.0000 | ORAL_TABLET | Freq: Four times a day (QID) | ORAL | 0 refills | Status: DC | PRN
  Filled 2024-01-22: qty 30, 4d supply, fill #0

## 2024-01-22 MED ORDER — HYDRALAZINE HCL 25 MG PO TABS: 25.0000 mg | ORAL_TABLET | Freq: Three times a day (TID) | ORAL | Status: DC | PRN

## 2024-01-22 MED ORDER — ONDANSETRON HCL 4 MG PO TABS
4.0000 mg | ORAL_TABLET | Freq: Four times a day (QID) | ORAL | 0 refills | Status: DC | PRN
  Filled 2024-01-22: qty 20, 5d supply, fill #0

## 2024-01-22 MED ORDER — DOCUSATE SODIUM 100 MG PO CAPS
100.0000 mg | ORAL_CAPSULE | Freq: Two times a day (BID) | ORAL | 0 refills | Status: DC
  Filled 2024-01-22: qty 10, 5d supply, fill #0

## 2024-01-22 MED ORDER — ENSURE PLUS HIGH PROTEIN PO LIQD: 237.0000 mL | Freq: Two times a day (BID) | ORAL | Status: DC

## 2024-01-22 NOTE — Discharge Instructions (Signed)
 Orthopaedic Trauma Service Discharge Instructions   General Discharge Instructions  Orthopaedic Injuries:  Right hip fracture treated with intramedullary nailing  WEIGHT BEARING STATUS: Weight-bear as tolerated right leg with assistance  RANGE OF MOTION/ACTIVITY: No motion restrictions right hip or right knee.  Activity as tolerated  Bone health: Labs show vitamin D  deficiency.  Please take vitamin D  supplements that have been prescribed for you  Review the following resource for additional information regarding bone health  [email or web address]  Wound Care: Daily wound care as needed starting upon return home.  See instructions below   Discharge Wound Care Instructions  Do NOT apply any ointments, solutions or lotions to pin sites or surgical wounds.  These prevent needed drainage and even though solutions like hydrogen peroxide kill bacteria, they also damage cells lining the pin sites that help fight infection.  Applying lotions or ointments can keep the wounds moist and can cause them to breakdown and open up as well. This can increase the risk for infection. When in doubt call the office.  Surgical incisions should be dressed daily.  If any drainage is noted, use one layer of adaptic or Mepitel, then gauze, and tape.  Alternatively you can use a silicone foam dressing such as a Mepilex which is what you currently have on  [email or web address] [email or web address]://[email or web address]-[email or web address]/?pd_rd_[email or web address]&[email or web address]  [email or web address].[email or web address]  These dressing supplies should be available at local medical supply stores ([employer], [employer] [employer], etc). They are not usually carried at places like [facility], [facility], [facility], etc  Once the incision is completely dry and without drainage, it may be left  open to air out.  Showering may begin 36-48 hours later.  Cleaning gently with soap and water.   DVT/PE prophylaxis: No pharmacologic prophylaxis due to increased fall risk.  Continue to mobilize is much as possible  Diet: as you were eating previously.  Can use over the counter stool softeners and bowel preparations, such as Miralax, to help with bowel movements.  Narcotics can be constipating.  Be sure to drink plenty of fluids  PAIN MEDICATION USE AND EXPECTATIONS  You have likely been given narcotic medications to help control your pain.  After a traumatic event that results in an fracture (broken bone) with or without surgery, it is ok to use narcotic pain medications to help control one's pain.  We understand that everyone responds to pain differently and each individual patient will be evaluated on a regular basis for the continued need for narcotic medications. Ideally, narcotic medication use should last no more than 6-8 weeks (coinciding with fracture healing).   As a patient it is your responsibility as well to monitor narcotic medication use and report the amount and frequency you use these medications when you come to your office visit.   We would also advise that if you are using narcotic medications, you should take a dose prior to therapy to maximize you participation.  IF YOU ARE ON NARCOTIC MEDICATIONS IT IS NOT PERMISSIBLE TO OPERATE A MOTOR VEHICLE (MOTORCYCLE/CAR/TRUCK/MOPED) OR HEAVY MACHINERY DO NOT MIX NARCOTICS WITH OTHER CNS (CENTRAL NERVOUS SYSTEM) DEPRESSANTS SUCH AS ALCOHOL   POST-OPERATIVE OPIOID TAPER INSTRUCTIONS: It is important to wean off of your opioid medication as soon as possible. If you do not need pain medication after your surgery it is ok to stop day one. Opioids include: Codeine, Hydrocodone (Norco, Vicodin), Oxycodone (Percocet, oxycontin ) and hydromorphone amongst others.  Long term and even short term use of opiods can cause: Increased pain  response Dependence Constipation Depression Respiratory depression  And more.  Withdrawal symptoms can include Flu like symptoms Nausea, vomiting And more Techniques to manage these symptoms Hydrate well Eat regular healthy meals Stay active Use relaxation techniques(deep breathing, meditating, yoga) Do Not substitute Alcohol to help with tapering If you have been on opioids for less than two weeks and do not have pain than it is ok to stop all together.  Plan to wean off of opioids This plan should start within one week post op of your fracture surgery  Maintain the same interval or time between taking each dose and first decrease the dose.  Cut the total daily intake of opioids by one tablet each day Next start to increase the time between doses. The last dose that should be eliminated is the evening dose.    STOP SMOKING OR USING NICOTINE PRODUCTS!!!!  As discussed nicotine severely impairs your body's ability to heal surgical and traumatic wounds but also impairs bone healing.  Wounds and bone heal by forming microscopic blood vessels (angiogenesis) and nicotine is a vasoconstrictor (essentially, shrinks blood vessels).  Therefore, if vasoconstriction occurs to these microscopic blood vessels they essentially disappear and are unable to deliver necessary nutrients to the healing tissue.  This is one modifiable factor that you can do to dramatically increase your chances of healing your injury.    (This means no smoking, no nicotine gum, patches, etc)  DO NOT USE NONSTEROIDAL ANTI-INFLAMMATORY DRUGS (NSAID'S)  Using products such as Advil  (ibuprofen ), Aleve  (naproxen ), Motrin  (ibuprofen ) for additional pain control during fracture healing can delay and/or prevent the healing response.  If you would like to take over the counter (OTC) medication, Tylenol  (acetaminophen ) is ok.  However, some narcotic medications that are given for pain control contain acetaminophen  as well. Therefore,  you should not exceed more than 4000 mg of tylenol  in a day if you do not have liver disease.  Also note that there are may OTC medicines, such as cold medicines and allergy medicines that my contain tylenol  as well.  If you have any questions about medications and/or interactions please ask your doctor/PA or your pharmacist.      ICE AND ELEVATE INJURED/OPERATIVE EXTREMITY  Using ice and elevating the injured extremity above your heart can help with swelling and pain control.  Icing in a pulsatile fashion, such as 20 minutes on and 20 minutes off, can be followed.    Do not place ice directly on skin. Make sure there is a barrier between to skin and the ice pack.    Using frozen items such as frozen peas works well as the conform nicely to the are that needs to be iced.  USE AN ACE WRAP OR TED HOSE FOR SWELLING CONTROL  In addition to icing and elevation, Ace wraps or TED hose are used to help limit and resolve swelling.  It is recommended to use Ace wraps or TED hose until you are informed to stop.    When using Ace Wraps start the wrapping distally (farthest away from the body) and wrap proximally (closer to the body)   Example: If you had surgery on your leg and you do not have a splint on, start the ace wrap at the toes and work your way up to the thigh        If you had surgery on your upper extremity and do not have a splint on, start the ace wrap at your fingers and work your way up to the upper arm  IF YOU  ARE IN A SPLINT OR CAST DO NOT REMOVE IT FOR ANY REASON   If your splint gets wet for any reason please contact the office immediately. You may shower in your splint or cast as long as you keep it dry.  This can be done by wrapping in a cast cover or garbage back (or similar)  Do Not stick any thing down your splint or cast such as pencils, money, or hangers to try and scratch yourself with.  If you feel itchy take benadryl as prescribed on the bottle for itching  IF YOU ARE IN A CAM  BOOT (BLACK BOOT)  You may remove boot periodically. Perform daily dressing changes as noted below.  Wash the liner of the boot regularly and wear a sock when wearing the boot. It is recommended that you sleep in the boot until told otherwise    Call office for the following: Temperature greater than 101F Persistent nausea and vomiting Severe uncontrolled pain Redness, tenderness, or signs of infection (pain, swelling, redness, odor or green/yellow discharge around the site) Difficulty breathing, headache or visual disturbances Hives Persistent dizziness or light-headedness Extreme fatigue Any other questions or concerns you may have after discharge  In an emergency, call 911 or go to an Emergency Department at a nearby hospital  HELPFUL INFORMATION  If you had a block, it will wear off between 8-24 hrs postop typically.  This is period when your pain may go from nearly zero to the pain you would have had postop without the block.  This is an abrupt transition but nothing dangerous is happening.  You may take an extra dose of narcotic when this happens.  You should wean off your narcotic medicines as soon as you are able.  Most patients will be off or using minimal narcotics before their first postop appointment.   We suggest you use the pain medication the first night prior to going to bed, in order to ease any pain when the anesthesia wears off. You should avoid taking pain medications on an empty stomach as it will make you nauseous.  Do not drink alcoholic beverages or take illicit drugs when taking pain medications.  In most states it is against the law to drive while you are in a splint or sling.  And certainly against the law to drive while taking narcotics.  You may return to work/school in the next couple of days when you feel up to it.   Pain medication may make you constipated.  Below are a few solutions to try in this order: Decrease the amount of pain medication if you  aren't having pain. Drink lots of decaffeinated fluids. Drink prune juice and/or each dried prunes  If the first 3 don't work start with additional solutions Take Colace - an over-the-counter stool softener Take Senokot - an over-the-counter laxative Take Miralax - a stronger over-the-counter laxative     CALL THE OFFICE WITH ANY QUESTIONS OR CONCERNS: [phone]   VISIT OUR WEBSITE FOR ADDITIONAL INFORMATION: [email or web address]

## 2024-01-22 NOTE — TOC Progression Note (Signed)
 Transition of Care [facility]) - Progression Note    Patient Details  Name: Andrew Lester MRN: [id number] Date of Birth: 1950/06/11  Transition of Care [facility]) CM/SW Contact  Bridget Cordella Simmonds, LCSW Phone Number: 01/22/2024, 9:47 AM  Clinical Narrative:   Per MD request, CSW spoke with pt wife Amy again today about DC home vs SNF.  Amy again reports she plans for pt to DC home and has adequate help at home (2 grandsons) to make this work.  Amy does not think SNF will go well based on pt cognition.     Expected Discharge Plan: Home w Home Health Services (wife declined SNF placement) Barriers to Discharge: No Barriers Identified               Expected Discharge Plan and Services   Discharge Planning Services: CM Consult                                           Social Drivers of Health (SDOH) Interventions SDOH Screenings   Social Connections: Unknown (10/03/2021)   Received from [facility]  Tobacco Use: Low Risk  (01/19/2024)    Readmission Risk Interventions     No data to display

## 2024-01-22 NOTE — Plan of Care (Signed)
  Problem: Clinical Measurements: Goal: Respiratory complications will improve Outcome: Progressing Goal: Cardiovascular complication will be avoided Outcome: Progressing   Problem: Elimination: Goal: Will not experience complications related to urinary retention Outcome: Progressing   Problem: Pain Managment: Goal: General experience of comfort will improve and/or be controlled Outcome: Progressing   Problem: Education: Goal: Knowledge of General Education information will improve Description: Including pain rating scale, medication(s)/side effects and non-pharmacologic comfort measures Outcome: Not Progressing   Problem: Activity: Goal: Risk for activity intolerance will decrease Outcome: Not Progressing   Problem: Skin Integrity: Goal: Risk for impaired skin integrity will decrease Outcome: Not Progressing

## 2024-01-22 NOTE — Progress Notes (Signed)
    Durable Medical Equipment  (From admission, onward)           Start     Ordered   01/22/24 1138  For home use only DME standard manual wheelchair with seat cushion  Once       Comments: Patient suffers from R hip fracture which impairs their ability to perform daily activities like bathing in the home.  A walker will not resolve issue with performing activities of daily living. A wheelchair will allow patient to safely perform daily activities. Patient can safely propel the wheelchair in the home or has a caregiver who can provide assistance. Length of need 6 months . Accessories: elevating leg rests (ELRs), wheel locks, extensions and anti-tippers.   01/22/24 1137   01/22/24 1009  DME Walker  Once       Question Answer Comment  Walker: With 5 Inch Wheels   Patient needs a walker to treat with the following condition Hip fracture (HCC)      01/22/24 1012   01/20/24 1113  For home use only DME Walker rolling  Once       Question Answer Comment  Walker: With 5 Inch Wheels   Patient needs a walker to treat with the following condition Fx      01/20/24 1112

## 2024-01-22 NOTE — TOC Progression Note (Addendum)
 Transition of Care [facility]) - Progression Note    Patient Details  Name: Andrew Lester MRN: [id number] Date of Birth: [birth year]  [facility] CM/SW Contact  Rosalva Jon Bloch, RN Phone Number: 01/22/2024, 9:59 AM  Clinical Narrative:    Referral made for DME RW with [employer] . Equipment will be delivered to bedside prior to d/c.  Referral made with Angie/ [facility]  for home health PT AND OT services and accepted, [facility] 9/4. Wife to provide transportation to home.No  RX med concerns.  Post hospital f/u noted on AVS.  9/3 @ 1010 Home health RN added for home health services and Angie with [facility] made aware.  9/3 1150 Referral made for W/C with [employer]. Equipment will be delivered to pt's room prior to d/c  Expected Discharge Plan: Home w Home Health Services (wife declined SNF placement) Barriers to Discharge: No Barriers Identified               Expected Discharge Plan and Services   Discharge Planning Services: CM Consult                     DME Arranged: Vannie rolling   Date DME Agency Contacted: 01/22/24 Time DME Agency Contacted: [phone] Representative spoke with at DME Agency: [facility] HH Arranged: PT, OT HH Agency: [facility] Date [facility] Agency Contacted: 01/22/24 Time HH Agency Contacted: [phone] Representative spoke with at [facility] Agency: Jon   Social Drivers of Health (SDOH) Interventions SDOH Screenings   Social Connections: Unknown (10/03/2021)   Received from [facility]  Tobacco Use: Low Risk  (01/19/2024)    Readmission Risk Interventions     No data to display

## 2024-01-22 NOTE — Plan of Care (Signed)
 Pt d/c to home, AVS reviewed follow up questions answered. IV removed and all other hospital equipment. Personal belongings returned. No other needs voiced at this time. BP (!) 114/45 (BP Location: Left Arm)   Pulse 67   Temp 98.2 F (36.8 C) (Oral)   Resp 19   Ht 5' 5 (1.651 m)   Wt 65.8 kg   SpO2 97%   BMI 24.13 kg/m  Andrew Lester 01/22/24 1:19 PM

## 2024-01-22 NOTE — Discharge Summary (Signed)
 Physician Discharge Summary   Patient: Andrew Lester MRN: [id number] DOB: March 30, 1951  Admit date:     01/19/2024  Discharge date: 01/22/24  Discharge Physician: Lonni SHAUNNA Dalton   PCP: Okey Carlin Redbird, MD     Recommendations at discharge:  Follow up with Orthopedics Dr. Celena for hip fracture in 1 week Follow up with PCP Dr. Okey in 1-2 weeks [facility] RN to obtain CBC in 1 week and fax to Dr. Okey Dr. Okey: Please treat osteoporosis if appropriate given fragility fracture     Discharge Diagnoses: Principal Problem:   Closed comminuted intertrochanteric fracture of proximal femur, right, initial encounter [facility]) Active Problems:   Fall at home, initial encounter   Dementia without behavioral disturbance (HCC)   Protein-calorie malnutrition, severe   Osteoporosis   Normocytic anemia     Hospital Course: 73 y.o. M with dementia, presented with fall and right hip fracture.  The patient was admitted to the hospital and orthopedics, Dr. Celena was consulted.  The patient underwent IM nailing of the right hip on 8/31.  Postoperative course was uncomplicated.  Skilled nursing rehab was recommended, after long discussion, family declined.  Wound care, ROM and weightbearing restrictions as outlined by orthopedics.  Orthopedics recommended against anticoagulation at discharge.  He had expected postoperative anemia, no indication for transfusion.          The [place]  Controlled Substances Registry was reviewed for this patient prior to discharge.  Consultants: Orthopedics, Dr. Celena Procedures performed: Intramedullary nailing of the right hip Disposition: Home health PT/OT, rolling walker, wheelchair Diet recommendation:  Discharge Diet Orders (From admission, onward)     Start     Ordered   01/22/24 0000  Diet - low sodium heart healthy        01/22/24 1012             DISCHARGE MEDICATION: Allergies as of 01/22/2024       Reactions   Amoxicillin Rash    Erythromycin Rash        Medication List     TAKE these medications    docusate sodium  100 MG capsule Commonly known as: COLACE Take 1 capsule (100 mg total) by mouth 2 (two) times daily.   feeding supplement Liqd Take 237 mLs by mouth 2 (two) times daily between meals.   HYDROcodone -acetaminophen  5-325 MG tablet Commonly known as: NORCO/VICODIN Take 1-2 tablets by mouth every 6 (six) hours as needed for moderate pain (pain score 4-6).   ondansetron  4 MG tablet Commonly known as: ZOFRAN  Take 1 tablet (4 mg total) by mouth every 6 (six) hours as needed for nausea.   SINEX LONG-ACTING NA Place 1 spray into the nose daily as needed (congestion).   tamsulosin  0.4 MG Caps capsule Commonly known as: FLOMAX  Take 0.4 mg by mouth every other day.               Durable Medical Equipment  (From admission, onward)           Start     Ordered   01/22/24 1138  For home use only DME standard manual wheelchair with seat cushion  Once       Comments: Patient suffers from R hip fracture which impairs their ability to perform daily activities like bathing in the home.  A walker will not resolve issue with performing activities of daily living. A wheelchair will allow patient to safely perform daily activities. Patient can safely propel the wheelchair in the home or has  a caregiver who can provide assistance. Length of need 6 months . Accessories: elevating leg rests (ELRs), wheel locks, extensions and anti-tippers.   01/22/24 1137   01/22/24 1009  DME Walker  Once       Question Answer Comment  Walker: With 5 Inch Wheels   Patient needs a walker to treat with the following condition Hip fracture (HCC)      01/22/24 1012   01/20/24 1113  For home use only DME Walker rolling  Once       Question Answer Comment  Walker: With 5 Inch Wheels   Patient needs a walker to treat with the following condition Fx      01/20/24 1112              Discharge Care Instructions   (From admission, onward)           Start     Ordered   01/22/24 0000  Discharge wound care:       Comments: Apply Mepilex or 4x4 gauze Change dressing once daily or if soiled   01/22/24 1012            Follow-up Information     Celena Sharper, MD. Schedule an appointment as soon as possible for a visit in 2 week(s).   Specialty: Orthopedic Surgery Contact information: [address] [place] [address] [phone]         Okey Carlin Redbird, MD Follow up.   Specialty: Family Medicine Contact information: [address] [place] [address] [phone]         [facility] Follow up.   Why: Home health services will be provided by [facility] ,start of care 01/23/2024 Contact information: [address], [place], [place] [address]  Phone: [phone]                Discharge Instructions     Diet - low sodium heart healthy   Complete by: As directed    Discharge wound care:   Complete by: As directed    Apply Mepilex or 4x4 gauze Change dressing once daily or if soiled   Increase activity slowly   Complete by: As directed        Discharge Exam: Filed Weights   01/19/24 1213  Weight: 65.8 kg    General: Pt appears underweight and with diffuse loss of subcutaneous muscle mass and fat Cardiovascular: RRR, nl S1-S2, no murmurs appreciated.   No LE edema.   Respiratory: Normal respiratory rate and rhythm.  CTAB without rales or wheezes. Abdominal: Abdomen soft and non-tender.  No distension or HSM.   SABRA   Condition at discharge: fair  The results of significant diagnostics from this hospitalization (including imaging, microbiology, ancillary and laboratory) are listed below for reference.   Imaging Studies: DG HIP UNILAT WITH PELVIS 2-3 VIEWS RIGHT Result Date: 01/19/2024 CLINICAL DATA:  Fracture, postop. EXAM: DG HIP (WITH OR WITHOUT PELVIS) 2-3V RIGHT COMPARISON:  Preoperative  imaging FINDINGS: Femoral intramedullary nail with trans trochanteric and distal locking screw fixation traverse proximal femur fracture. Persistent displacement of a lesser trochanteric fragment. Recent postsurgical change includes air and edema in the soft tissues. IMPRESSION: ORIF of proximal femur fracture. No immediate postoperative complication. Electronically Signed   By: Andrea Gasman LesterD.   On: 01/19/2024 22:37   DG HIP UNILAT WITH PELVIS 2-3 VIEWS RIGHT Result Date: 01/19/2024 CLINICAL DATA:  Elective surgery. EXAM: DG HIP (WITH OR WITHOUT  PELVIS) 2-3V RIGHT COMPARISON:  Preoperative imaging FINDINGS: Fluoroscopic spot views of the right hip submitted from the operating room. Femoral intramedullary nail with trans trochanteric and distal locking screw fixation traverse proximal femur fracture. Fluoroscopy time 1 minutes 47 seconds. Dose 20.93 mGy. IMPRESSION: Intraoperative fluoroscopy during right proximal femur fracture ORIF. Electronically Signed   By: Andrea Gasman LesterD.   On: 01/19/2024 19:35   DG C-Arm 1-60 Min-No Report Result Date: 01/19/2024 Fluoroscopy was utilized by the requesting physician.  No radiographic interpretation.   DG C-Arm 1-60 Min-No Report Result Date: 01/19/2024 Fluoroscopy was utilized by the requesting physician.  No radiographic interpretation.   DG Knee 1-2 Views Right Result Date: 01/19/2024 EXAM: 1 or 2 VIEW(S) XRAY OF THE RIGHT KNEE 01/19/2024 03:38:00 PM COMPARISON: None available. CLINICAL HISTORY: Closed comminuted intertrochanteric fracture of proximal end of right femur. FINDINGS: BONES AND JOINTS: No acute fracture. No focal osseous lesion. No joint dislocation. Moderate joint space narrowing and osteophyte formation within the lateral and patellofemoral compartments. SOFT TISSUES: Vascular calcifications. IMPRESSION: 1. No acute findings. 2. Moderate joint space narrowing and osteophyte formation within the lateral and patellofemoral compartments.  Electronically signed by: Lonni Necessary MD 01/19/2024 04:00 PM EDT RP Workstation: [id number]   DG Chest 1 View Result Date: 01/19/2024 CLINICAL DATA:  fall, pain EXAM: CHEST  1 VIEW COMPARISON:  Chest x-ray 09/12/2023 FINDINGS: The heart and mediastinal contours are unchanged. Atherosclerotic plaque No focal consolidation. No pulmonary edema. No pleural effusion. No pneumothorax. No acute osseous abnormality. IMPRESSION: 1. No active disease. 2.  Aortic Atherosclerosis (ICD10-I70.0). Electronically Signed   By: Morgane  Naveau LesterD.   On: 01/19/2024 14:02   DG HIP UNILAT W OR W/O PELVIS 2-3 VIEWS RIGHT Result Date: 01/19/2024 EXAM: 2 or more VIEW(S) XRAY OF THE RIGHT HIP 01/19/2024 01:48:12 PM COMPARISON: None available. CLINICAL HISTORY: Fall with right hip pain. FINDINGS: BONES AND JOINTS: Acute, comminuted displaced intertrochanteric fracture of right hip with varus deformity. Degenerative changes of lower lumbar spine. SOFT TISSUES: Vascular calcifications. IMPRESSION: 1. Acute displaced intertrochanteric fracture of the right proximal femur with varus deformity. Electronically signed by: Waddell Calk MD 01/19/2024 02:01 PM EDT RP Workstation: [id number]    Microbiology: No results found for this or any previous visit.  Labs: CBC: Recent Labs  Lab 01/19/24 1242 01/20/24 1003 01/21/24 0604 01/22/24 0413  WBC 8.8 9.7 8.5 6.5  HGB 13.4 11.5* 11.3* 9.9*  HCT 40.2 34.9* 33.1* 28.8*  MCV 92.8 93.1 91.9 91.1  PLT 174 147* 131* 128*   Basic Metabolic Panel: Recent Labs  Lab 01/19/24 1242 01/20/24 1003 01/21/24 0604 01/22/24 0413  NA 138 140 135 136  K 4.0 4.1 3.7 3.3*  CL 102 103 101 101  CO2 25 24 24 26   GLUCOSE 148* 142* 95 98  BUN 10 11 10 8   CREATININE 1.06 1.02 0.67 0.63  CALCIUM 9.0 8.3* 8.2* 8.1*  MG  --   --  1.7 1.9   Liver Function Tests: Recent Labs  Lab 01/21/24 0604  AST 38  ALT 39  ALKPHOS 32*  BILITOT 0.9  PROT 5.5*  ALBUMIN 3.0*   CBG: Recent  Labs  Lab 01/19/24 2149  GLUCAP 138*    Discharge time spent: approximately 25 minutes spent on discharge counseling, evaluation of patient on day of discharge, and coordination of discharge planning with nursing, social work, pharmacy and case management  Signed: Lonni SHAUNNA Dalton, MD [facility] 01/22/2024

## 2024-01-22 NOTE — Progress Notes (Addendum)
 Reviewed AVS, patient wife expressed understanding of medications, MD follow up reviewed.    See LDA for information on wounds at discharge.  Patient wife states all belongings brought to the hospital at time of admission are accounted for and packed to take home.   Patient wife informed and expressed understanding where to pick up discharge medications.   IV not removed at this time; Staff nurse Aureliano states he will remove IV closer to discharge time.  Patient has rolling walker ordered and will be delivered by [employer] before discharge; Wife has requested a  wheelchair for home use to assist with taking patient to medical appointments. CM Angela informed of Request.   Walker delivered to room at 11:40 a.m.

## 2024-01-22 NOTE — Progress Notes (Signed)
 Nutrition Follow Up  DOCUMENTATION CODES:   Severe malnutrition in context of chronic illness (dementia)  INTERVENTION:   Continue Regular diet with feeding assistance. Change to room service with assistance for meal ordering.   Ensure Plus High Protein po BID, each supplement provides 350 kcal and 20 grams of protein.  Continue Magic cup BID with meals, each supplement provides 290 kcal and 9 grams of protein  Provided High Calorie High Protein Nutrition Therapy handout from the [employer] to wife  NUTRITION DIAGNOSIS:   Severe Malnutrition related to chronic illness (dementia) as evidenced by severe fat depletion, severe muscle depletion, energy intake < or equal to 75% for > or equal to 1 month. New diagnosis  GOAL:   Patient will meet greater than or equal to 90% of their needs Progressing  MONITOR:   PO intake, Supplement acceptance, Weight trends  REASON FOR ASSESSMENT:   Consult Hip fracture protocol  ASSESSMENT:   Pt with PMH of dementia who lives at home with wife who is his caregiver (pt unbalanced at home but does not use any assistive devices for walking, he is difficult to understand and requires assistance with eating) admitted after a fall with R hip fx.    8/31 - s/p IM nailing of R hip  Pt remains lethargic and sleeping at time of assessment. Wife reports pt had a good day on 9/1 and ate very well. Otherwise, the last two days pt has only been eating on average 25% of meals. Wife reports PTA, pt's intake was up and down. He would have good days where he would eat 3 x per day and would eat variety of foods, but then he would have bad days where he would sleep most of the day and only wake up in the evening to eat 1 meal.   Educated on High Calorie High Protein Nutrition Therapy handout from the Academy and encouraged wife to optimize nutrition on the days where pt is awake and alert.   Wife reports pt had been working on his feet  and very active up until he retired 4-5 years ago. Within the last year, pt's activity level has declined and no longer goes on daily walks. Within that timeframe, pt's wife reports pt lost wt rapidly and she began to worry about pt but was being followed by PCP. Suspect severe depletions of muscle and fat occurred during that wt loss period in the last year. Wife unsure of exact amount of wt loss but was shocked to see the wt down to 140-145#. Suspect wt loss occurred due to chronic under nutrition of pt when he misses meals while he's lethargic.   Encouraged ONS protein supplements to aid in protein and calorie intake. Pt will be discharging today.  Average Meal Completion: 9/1-9/2: 39% average intake x 7 recorded meals  Medications reviewed and include: colace And vitamin D3 50000 units weekly   Labs reviewed:  Potassium 3.3 Vitamin D  12.51    NUTRITION - FOCUSED PHYSICAL EXAM:  Flowsheet Row Most Recent Value  Orbital Region Severe depletion  Upper Arm Region Severe depletion  Thoracic and Lumbar Region Severe depletion  Buccal Region Severe depletion  Temple Region Severe depletion  Clavicle Bone Region Moderate depletion  Clavicle and Acromion Bone Region Moderate depletion  Scapular Bone Region Moderate depletion  Dorsal Hand Severe depletion  Patellar Region Severe depletion  Anterior Thigh Region Severe depletion  Posterior Calf Region Severe depletion  Edema (RD Assessment) None  Hair Reviewed  Eyes Reviewed  Mouth Reviewed  Skin Reviewed  Nails Reviewed   Diet Order:   Diet Order             Diet - low sodium heart healthy           Diet regular Room service appropriate? Yes; Fluid consistency: Thin  Diet effective now                   EDUCATION NEEDS:   Education needs have been addressed  Skin:  Skin Assessment: Skin Integrity Issues: Skin Integrity Issues:: Incisions Incisions: R hip (surgical)  Last BM:  8/31  Height:   Ht Readings from  Last 1 Encounters:  01/19/24 5' 5 (1.651 m)    Weight:   Wt Readings from Last 1 Encounters:  01/19/24 65.8 kg    BMI:  Body mass index is 24.13 kg/m.  Estimated Nutritional Needs:   Kcal:  1800-2000  Protein:  100-110 grams  Fluid:  > 1.8 L/day  Josette Glance, MS, RDN, LDN Clinical Dietitian I Please reach out via secure chat

## 2024-01-22 NOTE — Progress Notes (Signed)
 Occupational Therapy Treatment Patient Details Name: Andrew Lester MRN: [id number] DOB: 12/07/1950 Today's Date: 01/22/2024   History of present illness Patient is a 73 yo male presenting to the ED after fall on 01/19/24. R hip fracture noted, with R IM nail completed on 8/31. PMH: dementia, Gout, HTN.   OT comments  Patient making incremental progress towards goals. Patient obtunded upon entry, and would not open his eyes but would respond to his name. Patient total A of 2 to transition to EOB with mobility specialist, opening eyes minimally, but then closing and would not open again despite efforts. Patient total A to attempt to complete basic grooming, and attempted sit<>stands x2, but no engagement noted. Patient returned to supine. OT recommendation remains appropriate, will continue to follow.       If plan is discharge home, recommend the following:  Two people to help with walking and/or transfers;Two people to help with bathing/dressing/bathroom;Assistance with cooking/housework;Assistance with feeding;Direct supervision/assist for medications management;Direct supervision/assist for financial management;Assist for transportation;Help with stairs or ramp for entrance;Supervision due to cognitive status   Equipment Recommendations  BSC/3in1;Hospital bed;Other (comment) (RW)    Recommendations for Other Services      Precautions / Restrictions Precautions Precautions: Fall Recall of Precautions/Restrictions: Impaired Restrictions Weight Bearing Restrictions Per Provider Order: Yes RLE Weight Bearing Per Provider Order: Weight bearing as tolerated       Mobility Bed Mobility Overal bed mobility: Needs Assistance Bed Mobility: Supine to Sit, Sit to Supine Rolling: Total assist, +2 for physical assistance, +2 for safety/equipment   Supine to sit: Total assist, +2 for physical assistance, +2 for safety/equipment Sit to supine: Total assist, +2 for safety/equipment, +2 for physical  assistance   General bed mobility comments: no command following, OT and mobility specialist completing all bed mobility due to activation    Transfers Overall transfer level: Needs assistance Equipment used: 2 person hand held assist Transfers: Sit to/from Stand, Bed to chair/wheelchair/BSC Sit to Stand: +2 physical assistance, From elevated surface, Total assist           General transfer comment: total A of 2, no initiation noted, attempted x2 with no differentiation in result     Balance Overall balance assessment: Needs assistance Sitting-balance support: Feet supported, Bilateral upper extremity supported Sitting balance-Leahy Scale: Poor Sitting balance - Comments: Hands on guarding provided almost constantly   Standing balance support: Bilateral upper extremity supported Standing balance-Leahy Scale: Zero Standing balance comment: +2 assist required                           ADL either performed or assessed with clinical judgement   ADL       Grooming: Total assistance;Sitting;Wash/dry face;Wash/dry hands                               Functional mobility during ADLs: Total assistance;+2 for safety/equipment;+2 for physical assistance;Cueing for safety;Cueing for sequencing;Rolling walker (2 wheels) General ADL Comments: Patient making incremental progress towards goals. Patient obtunded upon entry, and would not open his eyes but would respond to his name. Patient total A of 2 to transition to EOB with mobility specialist, opening eyes minimally, but then closing and would not open again despite efforts. Patient total A to attempt to complete basic grooming, and attempted sit<>stands x2, but no engagement noted. Patient returned to supine. OT recommendation remains appropriate, will continue to follow.  Extremity/Trunk Assessment              Vision       Restaurant manager, fast food Communication: No  apparent difficulties   Cognition Arousal: Obtunded Behavior During Therapy: WFL for tasks assessed/performed Cognition: Cognition impaired, History of cognitive impairments             OT - Cognition Comments: Obtunded to lethargic to date, unable to follow commands                 Following commands: Impaired Following commands impaired: Follows one step commands inconsistently      Cueing   Cueing Techniques: Verbal cues, Gestural cues  Exercises      Shoulder Instructions       General Comments      Pertinent Vitals/ Pain       Pain Assessment Pain Assessment: Faces Faces Pain Scale: Hurts even more Pain Location: R hip during mobility Pain Descriptors / Indicators: Operative site guarding, Sharp, Moaning, Grimacing Pain Intervention(s): Limited activity within patient's tolerance, Monitored during session, Repositioned  Home Living                                          Prior Functioning/Environment              Frequency  Min 2X/week        Progress Toward Goals  OT Goals(current goals can now be found in the care plan section)        Plan      Co-evaluation                 AM-PAC OT 6 Clicks Daily Activity     Outcome Measure   Help from another person eating meals?: Total Help from another person taking care of personal grooming?: Total Help from another person toileting, which includes using toliet, bedpan, or urinal?: Total Help from another person bathing (including washing, rinsing, drying)?: Total Help from another person to put on and taking off regular upper body clothing?: Total Help from another person to put on and taking off regular lower body clothing?: Total 6 Click Score: 6    End of Session Equipment Utilized During Treatment: Gait belt  OT Visit Diagnosis: Unsteadiness on feet (R26.81);Other abnormalities of gait and mobility (R26.89);Repeated falls (R29.6);Muscle weakness  (generalized) (M62.81);History of falling (Z91.81);Adult, failure to thrive (R62.7);Pain Pain - Right/Left: Right Pain - part of body: Hip   Activity Tolerance Patient limited by lethargy;Patient limited by fatigue;Other (comment) (Cognition)   Patient Left in bed;with call bell/phone within reach;with bed alarm set;with family/visitor present   Nurse Communication Mobility status        Time: 9069-9051 OT Time Calculation (min): 18 min  Charges: OT General Charges $OT Visit: 1 Visit OT Treatments $Self Care/Home Management : 8-22 mins  Ronal Gift E. Rondo Spittler, OTR/L Acute Rehabilitation Services [phone]   Ronal Gift Salt 01/22/2024, 2:50 PM

## 2024-02-11 ENCOUNTER — Institutional Professional Consult (permissible substitution): Admitting: Neurology

## 2024-03-26 ENCOUNTER — Other Ambulatory Visit: Payer: Self-pay | Admitting: Family Medicine

## 2024-03-26 DIAGNOSIS — R413 Other amnesia: Secondary | ICD-10-CM

## 2024-04-03 ENCOUNTER — Ambulatory Visit
Admission: RE | Admit: 2024-04-03 | Discharge: 2024-04-03 | Disposition: A | Discharge status: Home / Self Care | Source: Ambulatory Visit | Attending: Family Medicine | Admitting: Family Medicine

## 2024-04-03 DIAGNOSIS — R413 Other amnesia: Secondary | ICD-10-CM

## 2024-04-19 NOTE — Progress Notes (Unsigned)
 [facility]  PATIENT: Andrew Lester DOB: 1951-01-14  REFERRING DOCTOR OR PCP:  Carlin Gull, MD; Chiquita Sharps, MD SOURCE: patient, notes from PCP  _________________________________   HISTORICAL  CHIEF COMPLAINT:  No chief complaint on file.   HISTORY OF PRESENT ILLNESS:  I had the pleasure of seeing your patient, Andrew Lester, at [facility] for neurologic consultation regarding his severe dementia.  He is a 73 yo man who has had progressive cognitive decline over the past 6-7 years.     He has severe memory loss, short term worse than long term.   His daughter notes cognition and physical abilities both worsened after a hip fracture late August 2025.   He tripped on a single step in the house and landed on the right hip/back.  He did not hit his head or LOC.  Before the fall, he could walk 2 miles unassisted though balance was slightly off.   He had a good stride.     His daughter has needed to feed him since mid 2025 (though worsened further after hip fracture).      He wears Depends but will occasionally let his daughter know he needs to urinate though she needs to help him get to the bathroom and take off clothes.  He was continent before the injury.  He does not dress himself.   He stopped driving in [id number] and also no longer did any complex cognitive task after 2020.     He has never been on a dementia medication (I.e donepezil , rivastigmine).    He has had behavioral changes with agitation and even swung at his daughter a few times.   He is constantly confused.      He sleeps more than 12 hours a day.  He does not get days and nights confused.     In February 2020, he had a single visit with Dr. Ines for progressive memory decline that started in 2018 or 2019.SABRA  At that visit in 2020 he scored 27/30 on the Mini-Mental status exam  He is currently wheelchair bound.  He only bears a little weight with transfers since the fall       04/21/2024    1:41 PM 07/09/2018    9:08 AM  MMSE - Mini Mental State Exam  Not completed: Unable to complete   Orientation to time  5  Orientation to Place  5  Registration  3  Attention/ Calculation  3  Recall  3  Language- name 2 objects  2  Language- repeat  1  Language- follow 3 step command  3  Language- read & follow direction  1  Write a sentence  1  Copy design  0  Total score  27   He is otherwise healthy.    Family history: His mother had Alzheimer's.   Imaging: CT scan of the head 09/12/2023 showed mild generalized cortical atrophy possibly involving the medial temporal lobes out of proportion to elsewhere.  No acute findings.  MRI of the head 07/25/2018 showed no acute findings.  There were a small number of scattered T2/FLAIR hyperintense foci in the cerebral hemispheres.  Brain volume was normal for age.  REVIEW OF SYSTEMS: Constitutional: No fevers, chills, sweats, or change in appetite Eyes: No visual changes, double vision, eye pain Ear, nose and throat: No hearing loss, ear pain, nasal congestion, sore throat Cardiovascular: No chest pain, palpitations Respiratory:  No shortness of breath at rest or with exertion.  No wheezes GastrointestinaI: No nausea, vomiting, diarrhea, abdominal pain, fecal incontinence Genitourinary:  No dysuria, urinary retention or frequency.  No nocturia. Musculoskeletal:  No neck pain, back pain.  Recent hip fracture.    Integumentary: No rash, pruritus, skin lesions Neurological: as above Psychiatric: No depression at this time.  No anxiety Endocrine: No palpitations, diaphoresis, change in appetite, change in weigh or increased thirst Hematologic/Lymphatic:  No anemia, purpura, petechiae. Allergic/Immunologic: No itchy/runny eyes, nasal congestion, recent allergic reactions, rashes  ALLERGIES: Allergies  Allergen Reactions   Amoxicillin Rash   Erythromycin Rash    HOME MEDICATIONS:  Current Outpatient Medications:     docusate sodium  (COLACE) 100 MG capsule, Take 1 capsule (100 mg total) by mouth 2 (two) times daily., Disp: 10 capsule, Rfl: 0   feeding supplement (ENSURE PLUS HIGH PROTEIN) LIQD, Take 237 mLs by mouth 2 (two) times daily between meals., Disp: , Rfl:    HYDROcodone -acetaminophen  (NORCO/VICODIN) 5-325 MG tablet, Take 1-2 tablets by mouth every 6 (six) hours as needed for moderate pain (pain score 4-6)., Disp: 30 tablet, Rfl: 0   ondansetron  (ZOFRAN ) 4 MG tablet, Take 1 tablet (4 mg total) by mouth every 6 (six) hours as needed for nausea., Disp: 20 tablet, Rfl: 0   Oxymetazoline HCl (SINEX LONG-ACTING NA), Place 1 spray into the nose daily as needed (congestion)., Disp: , Rfl:    tamsulosin  (FLOMAX ) 0.4 MG CAPS capsule, Take 0.4 mg by mouth every other day., Disp: , Rfl:   PAST MEDICAL HISTORY: Past Medical History:  Diagnosis Date   Dementia (HCC)    ED (erectile dysfunction)    Gout    History of kidney stones    Hypertension    Kidney stones    Prediabetes     PAST SURGICAL HISTORY: Past Surgical History:  Procedure Laterality Date   CHOLECYSTECTOMY N/A 10/08/2022   Procedure: LAPAROSCOPIC CHOLECYSTECTOMY;  Surgeon: Ebbie Cough, MD;  Location: [facility] OR;  Service: General;  Laterality: N/A;   COLONOSCOPY  08/10/2005   INTRAMEDULLARY (IM) NAIL INTERTROCHANTERIC Right 01/19/2024   Procedure: FIXATION, FRACTURE, INTERTROCHANTERIC, WITH INTRAMEDULLARY ROD;  Surgeon: Celena Sharper, MD;  Location: [facility] OR;  Service: Orthopedics;  Laterality: Right;    FAMILY HISTORY: Family History  Problem Relation Age of Onset   Hypertension Mother    CAD Mother    Breast cancer Mother    Alzheimer's disease Mother    Osteoarthritis Mother    Diabetes Mother    Cancer Father        lung-smoker   Hypertension Father    Other Father        black lung    SOCIAL HISTORY: Social History   Socioeconomic History   Marital status: Married    Spouse name: Not on file   Number of children: 4    Years of education: 12   Highest education level: High school graduate  Occupational History   Not on file  Tobacco Use   Smoking status: Never   Smokeless tobacco: Never  Vaping Use   Vaping status: Never Used  Substance and Sexual Activity   Alcohol use: Not Currently    Comment: past- limited, now none    Drug use: Never   Sexual activity: Not on file  Other Topics Concern   Not on file  Social History Narrative   Lives at home with wife Amy   Right handed   Caffeine: once in awhile   Social Drivers of Health   Financial Resource Strain: Not  on file  Food Insecurity: Patient Unable To Answer (01/22/2024)   Hunger Vital Sign    Worried About Running Out of Food in the Last Year: Patient unable to answer    Ran Out of Food in the Last Year: Patient unable to answer  Transportation Needs: Patient Unable To Answer (01/22/2024)   PRAPARE - Transportation    Lack of Transportation (Medical): Patient unable to answer    Lack of Transportation (Non-Medical): Patient unable to answer  Physical Activity: Not on file  Stress: Not on file  Social Connections: Unknown (01/22/2024)   Social Connection and Isolation Panel    Frequency of Communication with Friends and Family: Patient unable to answer    Frequency of Social Gatherings with Friends and Family: Patient unable to answer    Attends Religious Services: Patient unable to answer    Active Member of Clubs or Organizations: Patient unable to answer    Attends Banker Meetings: Patient unable to answer    Marital Status: Not on file  Intimate Partner Violence: Patient Unable To Answer (01/22/2024)   Humiliation, Afraid, Rape, and Kick questionnaire    Fear of Current or Ex-Partner: Patient unable to answer    Emotionally Abused: Patient unable to answer    Physically Abused: Patient unable to answer    Sexually Abused: Patient unable to answer       PHYSICAL EXAM  There were no vitals filed for this  visit.  There is no height or weight on file to calculate BMI.   General: The patient is a thin male in no acute distress.  He was quiet unless directly addressed  HEENT:  Head is /AT.  Sclera are anicteric.  Funduscopic exam shows normal optic discs and retinal vessels.  Neck: No carotid bruits are noted.  The neck is nontender.  Cardiovascular: The heart has a regular rate and rhythm with a normal S1 and S2. There were no murmurs, gallops or rubs.    Skin: Extremities are without rash or  edema.  Musculoskeletal:  Back is nontender  Neurologic Exam  Mental status: He only answered simple questions.  He was not oriented to place or time.  He did not answer any questions in the Mini-Mental status exam.  He did not name items I pointed to the was able to say simple phrases.  He was able to follow a couple simple commands and follow visual cues.  Cranial nerves: Extraocular movements are full. Pupils are equal, round, and reactive to light and accomodation.  He blink to visual threat in all 4 quadrants.  Facial strength appeared normal.  Trapezius and sternocleidomastoid strength is normal. No dysarthria is noted. No obvious hearing deficits are noted.  Motor:  Muscle bulk is normal.   He has slightly increased tone in the arms.  Strength was 5/5 in both arms, at least 4+/5 in the left leg.  Strength was at least 4/5 in the right leg (the side he had the hip fracture a few months ago).   Sensory: He reacted to touch and vibration x 4.  Coordination: He did not follow commands so could not be tested  Gait and station: By himself, near the end of the visit, he stood up from the wheelchair using both arms and was able to support his weight in this manner.  He did not walk    Reflexes: Deep tendon reflexes are symmetric and normal bilaterally.   He had a palmomental reflex.  DIAGNOSTIC DATA (LABS, IMAGING, TESTING) - I reviewed patient records, labs, notes, testing and imaging  myself where available.  Lab Results  Component Value Date   WBC 6.5 01/22/2024   HGB 9.9 (L) 01/22/2024   HCT 28.8 (L) 01/22/2024   MCV 91.1 01/22/2024   PLT 128 (L) 01/22/2024      Component Value Date/Time   NA 136 01/22/2024 0413   NA 142 07/09/2018 1000   K 3.3 (L) 01/22/2024 0413   CL 101 01/22/2024 0413   CO2 26 01/22/2024 0413   GLUCOSE 98 01/22/2024 0413   BUN 8 01/22/2024 0413   BUN 11 07/09/2018 1000   CREATININE 0.63 01/22/2024 0413   CALCIUM 8.1 (L) 01/22/2024 0413   PROT 5.5 (L) 01/21/2024 0604   ALBUMIN 3.0 (L) 01/21/2024 0604   AST 38 01/21/2024 0604   ALT 39 01/21/2024 0604   ALKPHOS 32 (L) 01/21/2024 0604   BILITOT 0.9 01/21/2024 0604   GFRNONAA >60 01/22/2024 0413   GFRAA 88 07/09/2018 1000    Lab Results  Component Value Date   VITAMINB12 436 07/09/2018   Lab Results  Component Value Date   TSH 1.490 07/09/2018       ASSESSMENT AND PLAN  Alzheimer's disease, unspecified (CODE) (HCC) - Plan: CT HEAD WO CONTRAST ( )  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  He likely had a moderately severe dementia before his recent hip fracture.  However, since that fracture cognition has done worse.  Additionally, he had been walking independently (did not need cane or walker) before the accident but now is wheelchair-bound.  CT scan of the head from earlier this year showed atrophy that involve the medial temporal lobes more than elsewhere.  He likely has Alzheimer's disease that began 7 years ago.  It appears to be in the severe stage though may have been moderately severe before his recent hip fracture.  I discussed with his family that there is no medication that has  been shown to significantly alter the course of severe Alzheimer's disease though he might have a little benefit with the initiation of donepezil  and I sent in a prescription for 5 mg.  This can be double to 10 mg if well-tolerated. Additionally, because there was a subacute change after the fall we need to check a CT scan of the head to make sure that there is no subdural hematoma or stroke that also contributed.  Additionally I will check the amyloid beta 42/40 ratio.  That test is about 90% accurate to diagnose Alzheimer's disease if the ratio is reduced.  We would check blood work for TSH and B12. He gets occasional agitation/outburst.  His daughter feels that an as needed medicine would be adequate and I will send in a prescription for hydroxyzine.  If behavior worsens consider Rexulti There was some concern about the possibility of Parkinson's disease.  He was walking with a good stride before his hip injury and he does not have a tremor.  Therefore, Parkinson's disease is less likely. He will return as needed for new or worsening neurologic symptoms.  Thank you for asking me to see Andrew Lester.  Please let me know if I can be  of further assistance with him or other patients in the future.   Andrew Imel A. Vear, MD, [facility] 04/19/2024, 2:46 PM Certified in Neurology, Clinical Neurophysiology, Sleep Medicine and Neuroimaging  [facility] [address] [place], [place] [address] [phone]

## 2024-04-21 ENCOUNTER — Encounter: Payer: Self-pay | Admitting: Neurology

## 2024-04-21 ENCOUNTER — Ambulatory Visit: Admitting: Neurology

## 2024-04-21 VITALS — BP 106/70 | HR 105 | Resp 17 | Ht 67.0 in

## 2024-04-21 DIAGNOSIS — R296 Repeated falls: Secondary | ICD-10-CM

## 2024-04-21 DIAGNOSIS — G301 Alzheimer's disease with late onset: Secondary | ICD-10-CM | POA: Diagnosis not present

## 2024-04-21 DIAGNOSIS — G309 Alzheimer's disease, unspecified: Secondary | ICD-10-CM

## 2024-04-21 DIAGNOSIS — F02C18 Dementia in other diseases classified elsewhere, severe, with other behavioral disturbance: Secondary | ICD-10-CM

## 2024-04-21 DIAGNOSIS — R269 Unspecified abnormalities of gait and mobility: Secondary | ICD-10-CM | POA: Diagnosis not present

## 2024-04-21 DIAGNOSIS — S0990XA Unspecified injury of head, initial encounter: Secondary | ICD-10-CM

## 2024-04-21 DIAGNOSIS — R413 Other amnesia: Secondary | ICD-10-CM

## 2024-04-21 MED ORDER — HYDROXYZINE HCL 10 MG PO TABS: ORAL_TABLET | ORAL | 11 refills | Status: AC

## 2024-04-21 MED ORDER — DONEPEZIL HCL 5 MG PO TABS: 5.0000 mg | ORAL_TABLET | Freq: Every day | ORAL | 5 refills | Status: AC

## 2024-04-22 ENCOUNTER — Encounter: Payer: Self-pay | Admitting: Neurology

## 2024-04-22 ENCOUNTER — Telehealth: Payer: Self-pay | Admitting: Neurology

## 2024-04-22 LAB — TSH: TSH: 1.52 u[IU]/mL (ref 0.450–4.500)

## 2024-04-22 LAB — VITAMIN B12

## 2024-04-22 NOTE — Telephone Encounter (Signed)
 [employer]umr[employer]arrows #[id number] exp: 09/22/2024 no auth req'd per umr website [phone]   This was sent to [facility] but they called me and said he is not able to pivot so I sent the order to [facility] to call him to schedule. [phone]

## 2024-04-23 ENCOUNTER — Other Ambulatory Visit: Payer: Self-pay | Admitting: Family Medicine

## 2024-04-23 DIAGNOSIS — R413 Other amnesia: Secondary | ICD-10-CM

## 2024-05-01 ENCOUNTER — Ambulatory Visit (HOSPITAL_COMMUNITY): Admission: RE | Admit: 2024-05-01 | Discharge: 2024-05-01 | Attending: Family Medicine

## 2024-05-01 DIAGNOSIS — R413 Other amnesia: Secondary | ICD-10-CM

## 2024-05-13 ENCOUNTER — Other Ambulatory Visit: Payer: Self-pay | Admitting: Neurology

## 2024-05-26 ENCOUNTER — Institutional Professional Consult (permissible substitution): Admitting: Neurology

## 2024-05-29 ENCOUNTER — Other Ambulatory Visit: Payer: Self-pay | Admitting: Neurology

## 2024-05-29 ENCOUNTER — Telehealth: Payer: Self-pay

## 2024-05-29 DIAGNOSIS — F03B0 Unspecified dementia, moderate, without behavioral disturbance, psychotic disturbance, mood disturbance, and anxiety: Secondary | ICD-10-CM

## 2024-05-29 DIAGNOSIS — R413 Other amnesia: Secondary | ICD-10-CM

## 2024-05-29 NOTE — Telephone Encounter (Signed)
 Phone room please contact pt and ask to complete labs at any [facility]

## 2024-05-29 NOTE — Telephone Encounter (Signed)
-----   Message from Charlie Crete, MD sent at 2024/06/15 11:55 AM EST ----- Regarding: labs I saw him back in December.  At the visit we ordered some lab work but it looks like only some of the lab work was done.  I have reordered the Alzheimer blood test and vitamin B12.  He could go to any [facility] to get these done.

## 2024-06-21 DEATH — deceased
